# Patient Record
Sex: Female | Born: 1970 | Race: White | Hispanic: No | Marital: Married | Smoking: Never smoker
Health system: Southern US, Community
[De-identification: ages and names within clinical notes are randomized; demographics above are authoritative.]

## PROBLEM LIST (undated history)

## (undated) DIAGNOSIS — I2699 Other pulmonary embolism without acute cor pulmonale: Secondary | ICD-10-CM

## (undated) DIAGNOSIS — Z86718 Personal history of other venous thrombosis and embolism: Secondary | ICD-10-CM

## (undated) DIAGNOSIS — M1990 Unspecified osteoarthritis, unspecified site: Secondary | ICD-10-CM

## (undated) DIAGNOSIS — K5900 Constipation, unspecified: Secondary | ICD-10-CM

## (undated) HISTORY — DX: Personal history of other venous thrombosis and embolism: Z86.718

## (undated) HISTORY — PX: OTHER SURGICAL HISTORY: SHX169

## (undated) HISTORY — PX: COLONOSCOPY: SHX174

## (undated) HISTORY — DX: Unspecified osteoarthritis, unspecified site: M19.90

## (undated) HISTORY — DX: Constipation, unspecified: K59.00

## (undated) HISTORY — PX: BUNIONECTOMY: SHX129

---

## 1997-10-11 ENCOUNTER — Other Ambulatory Visit: Admission: RE | Admit: 1997-10-11 | Discharge: 1997-10-11 | Payer: Self-pay | Admitting: *Deleted

## 1998-10-24 ENCOUNTER — Other Ambulatory Visit: Admission: RE | Admit: 1998-10-24 | Discharge: 1998-10-24 | Payer: Self-pay | Admitting: Obstetrics and Gynecology

## 1999-05-08 ENCOUNTER — Other Ambulatory Visit: Admission: RE | Admit: 1999-05-08 | Discharge: 1999-05-08 | Payer: Self-pay | Admitting: Obstetrics and Gynecology

## 1999-11-30 ENCOUNTER — Inpatient Hospital Stay (HOSPITAL_COMMUNITY): Admission: AD | Admit: 1999-11-30 | Discharge: 1999-11-30 | Payer: Self-pay | Admitting: *Deleted

## 1999-12-01 ENCOUNTER — Inpatient Hospital Stay (HOSPITAL_COMMUNITY): Admission: AD | Admit: 1999-12-01 | Discharge: 1999-12-04 | Payer: Self-pay | Admitting: Obstetrics and Gynecology

## 1999-12-01 ENCOUNTER — Encounter (INDEPENDENT_AMBULATORY_CARE_PROVIDER_SITE_OTHER): Payer: Self-pay | Admitting: Specialist

## 1999-12-08 ENCOUNTER — Encounter: Admission: RE | Admit: 1999-12-08 | Discharge: 2000-03-07 | Payer: Self-pay | Admitting: Obstetrics and Gynecology

## 2000-01-22 ENCOUNTER — Other Ambulatory Visit: Admission: RE | Admit: 2000-01-22 | Discharge: 2000-01-22 | Payer: Self-pay | Admitting: Obstetrics and Gynecology

## 2001-02-25 ENCOUNTER — Other Ambulatory Visit: Admission: RE | Admit: 2001-02-25 | Discharge: 2001-02-25 | Payer: Self-pay | Admitting: Obstetrics and Gynecology

## 2002-03-20 ENCOUNTER — Other Ambulatory Visit: Admission: RE | Admit: 2002-03-20 | Discharge: 2002-03-20 | Payer: Self-pay | Admitting: Obstetrics and Gynecology

## 2003-03-15 ENCOUNTER — Other Ambulatory Visit: Admission: RE | Admit: 2003-03-15 | Discharge: 2003-03-15 | Payer: Self-pay | Admitting: Obstetrics and Gynecology

## 2003-09-24 ENCOUNTER — Inpatient Hospital Stay (HOSPITAL_COMMUNITY): Admission: AD | Admit: 2003-09-24 | Discharge: 2003-09-24 | Payer: Self-pay | Admitting: Obstetrics & Gynecology

## 2003-09-26 ENCOUNTER — Encounter (INDEPENDENT_AMBULATORY_CARE_PROVIDER_SITE_OTHER): Payer: Self-pay | Admitting: Specialist

## 2003-09-26 ENCOUNTER — Inpatient Hospital Stay (HOSPITAL_COMMUNITY): Admission: RE | Admit: 2003-09-26 | Discharge: 2003-09-29 | Payer: Self-pay | Admitting: Obstetrics and Gynecology

## 2003-11-06 ENCOUNTER — Other Ambulatory Visit: Admission: RE | Admit: 2003-11-06 | Discharge: 2003-11-06 | Payer: Self-pay | Admitting: Obstetrics and Gynecology

## 2005-06-24 ENCOUNTER — Other Ambulatory Visit: Admission: RE | Admit: 2005-06-24 | Discharge: 2005-06-24 | Payer: Self-pay | Admitting: Obstetrics and Gynecology

## 2007-11-11 ENCOUNTER — Encounter (HOSPITAL_COMMUNITY): Payer: Self-pay | Admitting: Obstetrics and Gynecology

## 2007-11-11 ENCOUNTER — Ambulatory Visit (HOSPITAL_COMMUNITY): Admission: RE | Admit: 2007-11-11 | Discharge: 2007-11-11 | Payer: Self-pay | Admitting: Obstetrics and Gynecology

## 2010-06-21 ENCOUNTER — Emergency Department (HOSPITAL_COMMUNITY): Payer: BLUE CROSS/BLUE SHIELD

## 2010-06-21 ENCOUNTER — Observation Stay (HOSPITAL_COMMUNITY)
Admission: EM | Admit: 2010-06-21 | Discharge: 2010-06-23 | Disposition: A | Discharge status: Home / Self Care | Payer: BLUE CROSS/BLUE SHIELD | Attending: Internal Medicine | Admitting: Internal Medicine

## 2010-06-21 DIAGNOSIS — A088 Other specified intestinal infections: Secondary | ICD-10-CM | POA: Insufficient documentation

## 2010-06-21 DIAGNOSIS — R0602 Shortness of breath: Secondary | ICD-10-CM | POA: Insufficient documentation

## 2010-06-21 DIAGNOSIS — G43909 Migraine, unspecified, not intractable, without status migrainosus: Secondary | ICD-10-CM | POA: Insufficient documentation

## 2010-06-21 DIAGNOSIS — E876 Hypokalemia: Secondary | ICD-10-CM | POA: Insufficient documentation

## 2010-06-21 DIAGNOSIS — I2699 Other pulmonary embolism without acute cor pulmonale: Principal | ICD-10-CM | POA: Insufficient documentation

## 2010-06-21 DIAGNOSIS — R071 Chest pain on breathing: Secondary | ICD-10-CM | POA: Insufficient documentation

## 2010-06-21 DIAGNOSIS — D72829 Elevated white blood cell count, unspecified: Secondary | ICD-10-CM | POA: Insufficient documentation

## 2010-06-21 LAB — BASIC METABOLIC PANEL
BUN: 4 mg/dL — ABNORMAL LOW (ref 6–23)
CO2: 25 mEq/L (ref 19–32)
Calcium: 8.4 mg/dL (ref 8.4–10.5)
Creatinine, Ser: 0.64 mg/dL (ref 0.4–1.2)
GFR calc Af Amer: >60 mL/min (ref >60)

## 2010-06-21 LAB — DIFFERENTIAL
Basophils Absolute: 0 10*3/uL (ref 0.0–0.1)
Eosinophils Absolute: 0.2 10*3/uL (ref 0.0–0.7)
Lymphocytes Relative: 10 % — ABNORMAL LOW (ref 12–46)
Neutro Abs: 16.5 10*3/uL — ABNORMAL HIGH (ref 1.7–7.7)
Neutrophils Relative %: 87 % — ABNORMAL HIGH (ref 43–77)

## 2010-06-21 LAB — CBC
HCT: 36.6 % (ref 36.0–46.0)
Hemoglobin: 12.4 g/dL (ref 12.0–15.0)
MCH: 32.4 pg (ref 26.0–34.0)
MCV: 95.6 fL (ref 78.0–100.0)
Platelets: 215 10*3/uL (ref 150–400)
RBC: 3.83 MIL/uL — ABNORMAL LOW (ref 3.87–5.11)
RDW: 12.9 % (ref 11.5–15.5)
WBC: 19 10*3/uL — ABNORMAL HIGH (ref 4.0–10.5)

## 2010-06-21 LAB — D-DIMER, QUANTITATIVE: D-Dimer, Quant: 0.74 ug/mL-FEU — ABNORMAL HIGH (ref 0.00–0.48)

## 2010-06-21 LAB — POCT CARDIAC MARKERS
Myoglobin, poc: 34.3 ng/mL (ref 12–200)
Troponin i, poc: <0.05 ng/mL (ref 0.00–0.09)

## 2010-06-21 MED ORDER — IOHEXOL 300 MG/ML  SOLN: 100.0000 mL | Freq: Once | INTRAMUSCULAR | Status: AC | PRN

## 2010-06-22 LAB — BASIC METABOLIC PANEL
BUN: 4 mg/dL — ABNORMAL LOW (ref 6–23)
CO2: 25 mEq/L (ref 19–32)
Calcium: 8.7 mg/dL (ref 8.4–10.5)
GFR calc Af Amer: >60 mL/min (ref >60)
GFR calc non Af Amer: >60 mL/min (ref >60)
Glucose, Bld: 93 mg/dL (ref 70–99)
Potassium: 3.6 mEq/L (ref 3.5–5.1)
Sodium: 139 mEq/L (ref 135–145)

## 2010-06-22 LAB — CBC
MCH: 31.3 pg (ref 26.0–34.0)
RDW: 12.9 % (ref 11.5–15.5)

## 2010-06-22 LAB — CARDIAC PANEL(CRET KIN+CKTOT+MB+TROPI)
CK, MB: 1 ng/mL (ref 0.3–4.0)
Relative Index: INVALID (ref 0.0–2.5)
Total CK: 51 U/L (ref 7–177)
Total CK: 48 U/L (ref 7–177)
Troponin I: 0.04 ng/mL (ref 0.00–0.06)

## 2010-06-23 DIAGNOSIS — I2699 Other pulmonary embolism without acute cor pulmonale: Secondary | ICD-10-CM

## 2010-06-23 LAB — MAGNESIUM: Magnesium: 2.2 mg/dL (ref 1.5–2.5)

## 2010-06-23 LAB — URINALYSIS, ROUTINE W REFLEX MICROSCOPIC
Bilirubin Urine: NEGATIVE
Glucose, UA: NEGATIVE mg/dL
Protein, ur: NEGATIVE mg/dL
Specific Gravity, Urine: 1.014 (ref 1.005–1.030)
pH: 7 (ref 5.0–8.0)

## 2010-06-23 LAB — PROTIME-INR
INR: 0.98 (ref 0.00–1.49)
Prothrombin Time: 13.2 seconds (ref 11.6–15.2)

## 2010-06-23 LAB — COMPREHENSIVE METABOLIC PANEL
CO2: 25 mEq/L (ref 19–32)
Calcium: 8.5 mg/dL (ref 8.4–10.5)
Creatinine, Ser: 0.69 mg/dL (ref 0.4–1.2)
GFR calc non Af Amer: >60 mL/min (ref >60)
Glucose, Bld: 94 mg/dL (ref 70–99)
Sodium: 140 mEq/L (ref 135–145)
Total Protein: 6.5 g/dL (ref 6.0–8.3)

## 2010-06-23 LAB — CBC
HCT: 36.1 % (ref 36.0–46.0)
Hemoglobin: 11.9 g/dL — ABNORMAL LOW (ref 12.0–15.0)
RBC: 3.79 MIL/uL — ABNORMAL LOW (ref 3.87–5.11)

## 2010-06-23 LAB — PHOSPHORUS: Phosphorus: 3.9 mg/dL (ref 2.3–4.6)

## 2010-06-23 LAB — URINE MICROSCOPIC-ADD ON

## 2010-06-24 LAB — URINE CULTURE
Colony Count: NO GROWTH
Culture: NO GROWTH

## 2010-06-24 NOTE — Discharge Summary (Signed)
Tara Tucker, Tara Tucker                  ACCOUNT NO.:  [id number]  MEDICAL RECORD NO.:  [id number]           PATIENT TYPE:  I  LOCATION:  5527                         FACILITY:  [facility]  PHYSICIAN:  Marinda Elk, M.D.DATE OF BIRTH:  [birth year]  DATE OF ADMISSION:  06/21/2010 DATE OF DISCHARGE:  06/23/2010                              DISCHARGE SUMMARY   PRIMARY CARE DOCTOR:  [facility] [place]  DISCHARGE DIAGNOSES: 1. Acute bilateral small pulmonary embolisms. 2. Leukocytosis. 3. Hyperkalemia. 4. Viral gastroenteritis.  DISCHARGE MEDICATIONS: 1. Guaifenesin 120 mg subcutaneous daily. 2. Warfarin 5 mg daily. 3. Stop taking Excedrin. 4. Hydrocodone 5/325mg  por q4hrs prn.  CONSULTANTS:  None.  PROCEDURES PERFORMED: 1. CT angio of the chest that showed positive study for peripheral PE     in the lower lungs. 2. Chest x-ray that showed linear atelectasis at the lungs bases.  BRIEF ADMITTING HISTORY AND PHYSICAL:  This is a 40 year old female with no significant past medical history who was in regular state of health. She had just spent her spring vacation away.  She had a flight of 3 hours on Monday for a business job, then had to drive from [place] to [place] in night.  After this, she started complaining of some pleuritic chest pain, tightness and shortness of breath.  She presented to the Urgent Care with an elevated D-dimer and was transferred to [facility].  CT angio was done that showed results above.  She also admits to feeling nauseated.  She has been having three episodes of vomiting yesterday and before that she has some diarrhea before flying, but not today.  She denies any abdominal pain, extremity swelling.  Denies any palpitations and heaviness with deep inspiration.  PHYSICAL EXAMINATION:  VITAL SIGNS:  Temperature 98, blood pressure 136/72, pulse 81, respirations 18, she is satting 98% on room air. HEENT:  Normocephalic, atraumatic.   Pupils equally round and reactive to light and accommodation.  Extraocular movements are intact. NECK:  Supple.  No JVD. CARDIOVASCULAR:  She has a regular rate and rhythm with a positive S1. LUNGS:  Good air movement and clear to auscultation. ABDOMEN:  Positive bowel sounds, nontender, nondistended, soft. EXTREMITIES:  Positive pulses, no clubbing, cyanosis or edema.  Hoffman negative. CNS:  Wake, alert and oriented x4.  Nonfocal.  CT angio results are above.  White count 19, hemoglobin of 12, platelet count of 250.  Sodium 138, potassium 3.4, chloride 180, bicarb of 25, glucose of 104, BUN of 4, creatinine 0.6, and calcium of 8.5.  ASSESSMENT AND PLAN: 1. Acute bilateral pulmonary embolism probably secondary to     combination of dehydration secondary to viral gastroenteritis and     long plane flight with a long drive.  On admission, she was stable,     she was on IV heparin and Coumadin.  She was given one dose of 7.5.     She was changed to Lovenox and was sent home on two days of 7.5 and     then 5 mg daily.  She will follow up with her primary care doctor  with no complications.  I have told to stay off the Excedrin as     this has aspirin and has not a good combination to be on Coumadin     and taking aspirin.  Does not need it for headaches. 2. Leukocytosis, resolved that is probably secondary to acute     bilateral pulmonary embolism.  She remained afebrile. 3. Hypokalemia that is probably secondary to dehydration and vomiting.     This was repleted.  The magnesium was checked, which was within     normal limits. 4. Viral gastroenteritis.  She has Zofran and IV fluids.  This is     resolved.  She is now tolerating her diet without any difficulties.  VITAL SIGNS ON THE DAY OF DISCHARGE:  Temperature 98, pulse 79, respiration 18, blood pressure 96/71, she is satting 93% on room air.  LABS ON THE DAY OF DISCHARGE:  Urine microscopy negative.  Her urine pregnancy test is  negative.  Her phosphorus is 3.9.  Her magnesium is 2.2.  Her sodium is 140, potassium 3.9, chloride 110, bicarb of 25, glucose of 94, BUN of 9, creatinine is 0.6.  LFTs are within normal limits except for alkaline phosphatase, which is borderline low at 31, albumin of 3.2.  Her white count of 7.6, hemoglobin of 11.2, and an MCV of 95, and platelet count of 185.  Her INR is 0.9.     Marinda Elk, M.D.     AF/MEDQ  D:  06/23/2010  T:  06/24/2010  Job:  [id number]  cc:   [facility]  Electronically Signed by Lambert Keto M.D. on 06/24/2010 03:31:09 PM

## 2010-07-03 NOTE — H&P (Signed)
NAME:  Tara Tucker, Tara Tucker                  ACCOUNT NO.:  [id number]  MEDICAL RECORD NO.:  [id number]           PATIENT TYPE:  E  LOCATION:  [facility]                         FACILITY:  [facility]  PHYSICIAN:  Kalicia Dufresne I Rafaela Dinius, MD      DATE OF BIRTH:  [birth year]  DATE OF ADMISSION:  06/21/2010 DATE OF DISCHARGE:                             HISTORY & PHYSICAL   PRIMARY CARE PHYSICIAN:  [facility].  CHIEF COMPLAINT:  Shortness of breath and chest pain for 1 day.  HISTORY OF PRESENT ILLNESS:  This is a 39 year old female with no previous medical history, who was on her regular status health.  She had just spent a spring vacation of [place].  She had to fly 3 hours and she is supposed to fly again on Monday for business/job.  Last night, she started to complain of some pleuritic chest pain and tightness 4/10 associated with shortness of breath.  She has to present to Urgent Care, where a D-Dimer found to be elevated and the [facility] was[facility] evaluation of possibility of pulmonary embolism.  CT angio of the chest bilateral small PE.  The patient currently complained of some migraine headache.  She also admitted and she is feeling nauseated.  She had experienced 3 episode of vomiting yesterday, but not today and 3-4 episode of diarrhea yesterday, but not today.  Denies any abdominal pain, denies any lower extremity swelling and denies any palpitation.  She is still having some heaviness on her chest, may leave with deep inspiration.  PAST MEDICAL HISTORY:  None.  PAST SURGICAL HISTORY:  She has history of tubal ligation and D and C of her endometrial polyp.  She says she did not have anymore menstrual cycle after the D and C procedure.  FAMILY HISTORY:  No history of blood clot and both parent were alive with no active medical issue.  She denies any use of contraceptive.  SOCIAL HISTORY:  She denies any alcohol abuse or drinking.  She work as a Research scientist (medical) at [employer].  She has 2 children.  She lives with her husband.  REVIEW OF SYSTEMS:  Systemic review as above.  PHYSICAL EXAMINATION:  VITAL SIGNS:  Temperature 98.1, blood pressure 136/72, pulse 81, respiratory rate 18 and O2 sat 98% on room air. HEENT:  Normocephalic and atraumatic.  Pupils are equal, reactive to light and accommodation.  Extraocular muscle movement within normal. NECK:  Supple.  No lymphadenopathy. HEART:  S1 and S2 with no added sounds. LUNGS:  Normal vesicular breathing with equal air entry. ABDOMEN:  Soft and nontender.  Bowel sounds positive. EXTREMITIES:  Without lower edema.  Hoffmann sign negative. CNS:  The patient is awake, alert and oriented x3 with no focal neurological deficit.  PROCEDURE:  CT angio.  There is a small bilateral pulmonary emboli.  LABORATORY DATA:  White blood cells 19, hemoglobin 12.4, hematocrit 36.6 and platelets 215.  Sodium 138, potassium 3.4, chloride 108, CO2 25, glucose 104, BUN 4, creatinine 0.64, calcium 8.4.  ASSESSMENT: 1. Bilateral small pulmonary emboli. 2. Leukocytosis. 3. Hypokalemia.  PLAN: 1.  The patient will be admitted to telemetry.  The patient already     started on Lovenox.  We will continue with Lovenox per pharmacy.     Also, we will start on Coumadin.  The patient already was started     on Lovenox.  We will hold on thrombophilic panel secondary to the     interaction.  As we mentioned, the patient already started on     Lovenox.  We will cycle cardiac enzyme and get EKG. 2. Leukocytosis could be secondary to stress or positive urine tract     infection.  I will proceed with urinalysis and culture.  Chest x-     ray was negative.  The patient currently has no fever.  We will     hold on blood culture.  We will continue maintaining patient while     process. 3. Hypokalemia replacement. 4. Nausea, vomiting and diarrhea lasted for 1 day.  The patient     currently denies any nausea and vomiting, possibility  of viral     gastroenteritis.  I will treat the patient empirically with some     Zofran p.r.n.  Currently, the patient has no any diarrhea.  We will     hold on any stool studies and advance home care.  We will place the     patient on regular diet.  Abdomen seems soft and nontender.  I will     get the whole LFTs panel.  The patient will be admitted under     observation.  Further recommendation, hospital course progress.     Delainey Winstanley Bosie Helper, MD     HIE/MEDQ  D:  06/22/2010  T:  06/22/2010  Job:  [id number]  Electronically Signed by Ebony Cargo MD on 07/03/2010 11:31:23 AM

## 2010-07-22 NOTE — Op Note (Signed)
[name], Tara Tucker                  ACCOUNT NO.:  [id number]   MEDICAL RECORD NO.:  [id number]          PATIENT TYPE:  AMB   LOCATION:  SDC                           [facility]ACILITY:  [facility]   PHYSICIAN:  Zelphia Cairo, MD    DATE OF BIRTH:  August 17, 1970   DATE OF PROCEDURE:  11/11/2007  DATE OF DISCHARGE:                               OPERATIVE REPORT   PREOPERATIVE DIAGNOSES:  1. Menorrhagia.  2. Suspected endometrial polyp.   POSTOPERATIVE DIAGNOSES:  1. Menorrhagia.  2. Suspected endometrial polyp.   PROCEDURES:  1. Cervical block.  2. Hysteroscopy.  3. Dilation and curettage.  4. NovaSure ablation.   SURGEON:  Zelphia Cairo, MD   ANESTHESIA:  MAC with local.   SPECIMEN:  Endometrial curettings to pathology.   FLUID:  Deficit 25 mL.   COMPLICATIONS:  None.   ESTIMATED BLOOD LOSS:  Minimal.   The patient was stable and taken to recovery room.   PROCEDURE IN DETAIL:  [name] was taken to the operating room where she  was given adequate anesthesia, where she was placed in the dorsal  lithotomy position using Allen stirrups.  She was prepped and draped in  a sterile fashion and a red rubber catheter was used to drain her  bladder for approximately 50 mL of clear urine.  Bivalve speculum was  placed in the vagina and a single-tooth tenaculum was placed on the  anterior lip of the cervix.  The uterus was sounded to 8-1/2 cm.  Cervical length was sounded to 4 cm.  The cervix was then serially  dilated and the hysteroscope was inserted.  A survey of the uterine  cavity was performed.  There were noted to be 2 small polypoid masses on  the left posterior wall.  Bilateral ostia were visualized and appeared  normal.  The hysteroscope was then removed and a gentle curetting was  performed.  Specimen was placed on Telfa and passed off and sent to  pathology.  The NovaSure device was then inserted and an ablation was performed  using standard manufacture guidelines.  NovaSure was  removed.  Single-  tooth tenaculum was removed.  The cervix was noted to be hemostatic.  Speculum was removed and the patient was taken to recovery room in  stable condition.      Zelphia Cairo, MD  Electronically Signed     GA/MEDQ  D:  11/11/2007  T:  11/11/2007  Job:  [id number]

## 2010-07-25 NOTE — Discharge Summary (Signed)
[facility]  Patient:    Tara Tucker, Tara Tucker                           MRN: [id number] Adm. Date:  [id number] Disc. Date: 12/04/99 Attending:  Madelyn Flavors Dictator:   Danie Chandler, R.N.                           Discharge Summary  ADMISSION DIAGNOSIS:          Intrauterine pregnancy at term with onset of labor.  DISCHARGE DIAGNOSES:          1. Intrauterine pregnancy at term with onset                                  of labor.                               2. Arrest of descent.                               3. Chorioamnionitis.  PROCEDURE:                    On December 01, 1999, primary low transverse cesarean section.  REASON FOR ADMISSION:         The patient is a 40 year old married white female gravida 1, para 0 with an estimated date of confinement of December 02, 1999, placing her at approximately 39-6/[redacted] weeks gestation. Prenatal care has basically been uncomplicated.  Group B Beta strep culture was negative.  The patient had had complaints of leaking since November 29, 1999.  She had tested negative for rupture of membranes until the day of admission when she was found to be Nitrazine and fern positive as well as having uterine contractions.  An IUPC was placed on the morning of December 01, 1999, and the cervix at that time was 6 cm dilated, slightly swollen, -1 station, vertex with caput formation.  The patient pushed for approximately two and one quarter hours and the vertex did not descend below 0 station.  She developed a temperature of 101.  She had been on penicillin prophylactically because of her suspected status of being prolonged rupture. This was changed to Unasyn IV prior to surgery.  The recommendation for cesarean section was made.  HOSPITAL COURSE:              The patient was taken to the operating room and underwent the above noted procedure without complications.  This was productive of a viable female infant  with Apgars of 9 at one minute and 9 at five minutes.  An arterial cord pH was 7.33.  Postoperatively, the patient did well. She was continued on IV antibiotics and on postoperative day #1 the patients hemoglobin was 8.7, hematocrit 25.5 and white blood cell count 15.9. She was started on iron daily and had a repeat CBC ordered for the following day.  On postoperative day #2 the patient had a good return of bowel function and was tolerating a regular diet.  She was also ambulating well without difficulty and had good pain control.  Her hemoglobin on this day was 9.5. Her IV antibiotics were discontinued and she was continued  on iron daily.  She as discharged home on postoperative day #3.  CONDITION ON DISCHARGE:       Good.  DIET:                         Regular as tolerated.  ACTIVITY:                     No heavy lifting, no driving, no vaginal entry.  FOLLOW-UP:                    She is to follow up in the office in one to two weeks for incision check and she is to call for temperature greater than 100 degrees, persistent nausea, vomiting, heavy vaginal bleeding and/or redness or drainage from the incision site.  DISCHARGE MEDICATIONS:        1. Prenatal vitamin one p.o. q.d.                               2. Tylox as directed by M.D. DD:  12/04/99 TD:  12/04/99 Job: [id number]

## 2010-07-25 NOTE — Op Note (Signed)
NAME:  Tara Tucker, Tara Tucker                            ACCOUNT NO.:  [id number]   MEDICAL RECORD NO.:  [id number]                   PATIENT TYPE:  INP   LOCATION:  9135                                 FACILITY:  [facility]   PHYSICIAN:  Juluis Mire, M.D.                DATE OF BIRTH:  06-23-1970   DATE OF PROCEDURE:  09/26/2003  DATE OF DISCHARGE:                                 OPERATIVE REPORT   PREOPERATIVE DIAGNOSES:  Intrauterine pregnancy at 39 weeks with prior  cesarean section, desires a repeat, multiparous, desires sterility.   POSTOPERATIVE DIAGNOSES:  Intrauterine pregnancy at 39 weeks with prior  cesarean section, desires a repeat, multiparous, desires sterility.   OPERATION:  Low transverse cesarean section with bilateral tubal ligation.   SURGEON:  Juluis Mire, M.D.   ANESTHESIA:  Epidural.   ESTIMATED BLOOD LOSS:  500 mL.   PACKS/DRAINS:  None.   BLOOD REPLACED:  None.   COMPLICATIONS:  None.   INDICATIONS FOR PROCEDURE:  Dictated in the history and physical.   DESCRIPTION OF PROCEDURE:  The patient was taken to the OR and placed in  supine position with a left lateral tilt.  After a satisfactory level of  spinal anesthesia was obtained, the abdomen was prepped out with Betadine  and draped in a sterile field. A prior low transverse skin incision was  excised. The incision was then extended through the subcutaneous tissue. The  fascia was entered sharply, incision in the fascia extended laterally. The  fascia was taken off the muscle superiorly and inferiorly, rectus muscles  were separated in the midline.  Anterior peritoneum was entered, incision in  the peritoneum was extended both superiorly and inferiorly, a low transverse  bladder flap was developed without difficulty.  A low transverse uterine  incision was begun with a knife and extended using manual traction.  Amniotic fluid was clear, there was some large bleeding sinuses.  Vertex  would not deliver up  with manual traction, a vacuum extractor was put in  placed and with fundal pressure the vertex was delivered along with the  remaining fetus. The infant was a viable female weighing 9 pounds and 4  ounces, Apgar were 8 & 9, umbilical cord pH was 7.25.  The placenta was  delivered manually, the uterus was exteriorized for closure, uterus was  closed with running locking suture of #0 chromic using a two layer closure  technique. Areas of bleeding were brought under control with figure-of-  eights of 2-0 chromic.   The tubes and ovaries were unremarkable.  The patient again expressed desire  of sterilization.  A mid segment of each tube was elevated with a Babcock.  A hole was made in the avascular of the mesosalpinx. Individual ligatures of  #0 plain catgut were used to ligate _________ segmented tube. The  intervening segment of tube was excised.  The cut end  of the tube were then  cauterized using the Bovie. We had good hemostasis bilaterally, ovaries  appeared normal. The uterus was returned to the abdominal cavity, we  irrigated the pelvis, we had good hemostasis.  Muscles and peritoneum closed  with running suture of 3-0 Vicryl, fascia closed with running suture of #0  PDS. The subcu was closed with running suture of 3-0 Vicryl, skin was closed  with staples and Steri-Strips.  Sponge, instrument and needle count reported  as correct by circulating nurse x2.  Foley catheter remained clear at the  time of closure.  The patient tolerated the procedure well and was returned  to the recovery room in good condition.                                               Juluis Mire, M.D.    JSM/MEDQ  D:  09/26/2003  T:  09/26/2003  Job:  [id number]

## 2010-07-25 NOTE — H&P (Signed)
NAME:  Tara Tucker, Tara Tucker                            ACCOUNT NO.:  [id number]   MEDICAL RECORD NO.:  [id number]                   PATIENT TYPE:  INP   LOCATION:  NA                                   [facility]   PHYSICIAN:  Juluis Mire, M.D.                DATE OF BIRTH:  08-20-1970   DATE OF ADMISSION:  DATE OF DISCHARGE:                                HISTORY & PHYSICAL   The patient is a 40 year old, gravida 2, para 1, married white female who  presents for repeat cesarean section and permanent sterilization.   The patient's last menstrual period was October 22 giving her an estimated  date of confinement of July 29, estimated gestational age of [redacted] weeks.  This  is consistent with initial evaluation and early ultrasound.  She had had a  prior low transverse cesarean section for failure to progress. We had  offered her the option of trial labor.  However, she desires to proceed with  repeat cesarean section for which she is admitted at the present time. She  also desires permanent sterilization. Alternative forms of birth control  have been discussed, the potential irreversibility of sterilization is  explained. A failure rate of 1 in 200 is quoted, failure can be in the form  of ectopic pregnancy requiring further surgical management. Her prenatal  course has otherwise been uncomplicated.   ALLERGIES:  No known drug allergies.   MEDICATIONS:  Prenatal vitamins.   PRENATAL LABS:  She is A positive.  Negative antibody screen.  Nonreactive  serology.  Equivocal rubella screen, negative hepatitis B surface antigen,  nonreactive HIV screen. Her 50 g Glucola was also within normal limits.   For past medical history, family history and social history, please see  prenatal records.  Review of systems is noncontributory.   PHYSICAL EXAMINATION:  VITAL SIGNS:  The patient is afebrile with stable  vital signs.  HEENT:  The patient is normocephalic, pupils equal round and reactive to  light and accommodation. Extraocular movements intact.  Sclerae and  conjunctivae clear, oropharynx clear.  NECK:  Without thyromegaly.  BREASTS:  Glandular but no discreet masses.  LUNGS:  Clear.  CARDIOVASCULAR:  Regular rate and rhythm with a grade 2/6 systolic ejection  murmur, no clicks or gallops.  ABDOMEN:  Gravid uterus consistent with __________ fetal weight is  approximately 8 1/2 pounds.  PELVIC:  The cervix has been long and closed on prior exam, not examined  recently.  EXTREMITIES:  Trace edema.  NEUROLOGIC:  Grossly within normal limits.   IMPRESSION:  1. Intrauterine pregnancy at term with prior cesarean section, desires     repeat.  2. Multiparous, desired sterility.   PLAN:  Patient to undergo repeat cesarean section with bilateral tubal  ligation. The risks of surgery have been discussed including the risk of  infection, the risk of hemorrhage that could require transfusion  with the  risk of AIDS  or hepatitis. The risk of injury to adjacent organs that could require  further exploratory surgery, this includes bladder, bowel or ureters, risk  of deep venous thrombosis and pulmonary embolus.  The patient expressed an  understanding of the indications and risks.                                               Juluis Mire, M.D.    JSM/MEDQ  D:  09/26/2003  T:  09/26/2003  Job:  [id number]

## 2010-07-25 NOTE — Discharge Summary (Signed)
NAME:  Tara Tucker, HAEBERLE                            ACCOUNT NO.:  [id number]   MEDICAL RECORD NO.:  [id number]                   PATIENT TYPE:  INP   LOCATION:  9135                                 FACILITY:  [facility]   PHYSICIAN:  Freddy Finner, M.D.                DATE OF BIRTH:  1970/12/11   DATE OF ADMISSION:  09/26/2003  DATE OF DISCHARGE:  09/29/2003                                 DISCHARGE SUMMARY   ADMITTING DIAGNOSES:  1. Intrauterine pregnancy at 75 weeks estimated gestational age.  2. Previous cesarean section, desires repeat.  3. Multiparity, desires sterilization.   DISCHARGE DIAGNOSES:  1. Status post low transverse cesarean section.  2. Viable female infant.  3. Permanent sterilization.   PROCEDURE:  Repeat low transverse cesarean section.   REASON FOR ADMISSION:  Please see dictated H&P.   HOSPITAL COURSE:  The patient is a 40 year old white married female gravida  2 para 1 that was admitted to [facility] for a repeat  cesarean section and permanent sterilization.  The patient had been offered  a trial of labor; however, she desired to repeat with a repeat cesarean  section.  The patient had also requested permanent sterilization.  On the  morning of admission the patient was taken to the operating room where  spinal anesthesia was administered without difficulty.  A low transverse  incision was made with the delivery of a viable female infant weighing 9  pounds 4 ounces, Apgars of 8 at one minute and 9 at five minutes.  Umbilical  cord pH was 7.25.  Bilateral tubal ligation was performed without  difficulty.  On postoperative day #1 the patient was without complaint.  Vital signs were stable; she was afebrile.  Abdomen was soft with good  return of bowel function.  Abdominal dressing was noted to be clean, dry,  and intact.  Fundus was firm and nontender.  Labs revealed hemoglobin of  8.3; platelet count 145,000; wbc count of 11.2.  On postoperative day #2  the  patient was without complaint.  Vital signs were stable.  Abdomen was soft,  fundus was firm and nontender.  Incision was noted to have a small amount of  oozing noted from the mid point and the left margin; however, no active  bleeding was noted.  She was ambulating well.  Repeat hemoglobin revealed  hemoglobin of 8.3; platelet count 148,000; wbc count of 11.4.  The patient  was started on some iron supplementation.  On postoperative day #3 the  patient was without complaint.  Vital signs were stable.  Incision was noted  to have a scant amount of oozing; however, the skin seemed to be intact.  The patient was tolerating a regular diet and ambulating well.  Staples  remained intact and the patient was discharged home.   CONDITION ON DISCHARGE:  Good.   DIET:  Regular as tolerated.  ACTIVITY:  No heavy lifting, no driving x2 weeks, no vaginal entry.   FOLLOW-UP:  The patient is to follow up in the office in 3-4 days for staple  removal and an incision check.  She is to call for temperature greater than  100 degrees, persistent nausea and vomiting, heavy vaginal bleeding, and/or  redness or drainage from the incisional site.   DISCHARGE MEDICATIONS:  1. Percocet 5/325 #30 one p.o. q.4-6h. p.r.n.  2. Prenatal vitamins one p.o. daily.  3. Chromagen Forte one p.o. daily p.r.n.     Julio Sicks, N.P.                        Freddy Finner, M.D.    CC/MEDQ  D:  10/17/2003  T:  10/17/2003  Job:  [id number]

## 2010-07-25 NOTE — Op Note (Signed)
[facility]  Patient:    Tara Tucker, Tara Tucker                           MRN: [id number] Proc. Date: 12/01/99 Adm. Date:  [id number] Attending:  Madelyn Flavors                           Operative Report  PREOPERATIVE DIAGNOSES:       1. Arrest of descent.                               2. Chorioamnionitis.  POSTOPERATIVE DIAGNOSES:      1. Arrest of descent.                               2. Chorioamnionitis.  OPERATION:                    Low transverse cesarean section.  SURGEON:                      Guy Sandifer. Arleta Creek, M.D.  ASSISTANT:  ANESTHESIA:                   Epidural anesthesia.  ANESTHESIOLOGIST:             Ellison Hughs., M.D.  ESTIMATED BLOOD LOSS:         800 cc.  FINDINGS:                     A viable female infant with Apgars of 9 and 9 at one and five minutes, respectively.  Arterial cord pH 7.33, birth weight pending.  INDICATIONS AND CONSENT:      The patient is a 40 year old married white female, G1, P0, with an EDC of December 02, 1999, placing her at 39-6/7 weeks.  Prenatal care was essentially uncomplicated.  Group B Beta strep culture is negative.  She had complaints of leaking since November 29, 1999. She had negative testing for rupture of membranes until the day of admission when she was found to be Nitrazine and fern positive as well as having uterine contractions.  An IUPC was placed at 8 a.m. and the cervix is 6 cm dilated, slightly swollen, -1 station, vertex, with caput formation.  Course of her labor is followed closely.  She pushes for two and one quarter hours.  The vertex does not descend below 0 station. She also developed temperature of 101.  She had been on penicillin prophylactically because of her suspected status of being prolonged rupture.  This was changed to Unasyn IV immediately prior to surgery.  Recommendation for cesarean section was made. Risks and complications were discussed with  the patient and her husband including, but not limited to infection, bowel, bladder or ureteral damage, bleeding requiring transfusion of blood product and possible transfusion reaction, HIV and hepatitis acquisition, DVT, PE, pneumonia.  All questions answered.  DESCRIPTION OF PROCEDURE:     The patient was taken to the operating room. Foley catheter is in place. Epidural is augmented to the surgical level.  She is prepped and draped in sterile fashion.  She is found to have a single hot spot right in the midline. This is injected subcutaneously with 0.25% plain Marcaine.  Skin is then incised in a low transverse manner and dissection carried out in layers to the peritoneum.  Peritoneum is sharply incised and extended superiorly and inferiorly.  Vesicouterine peritoneum is taken down cephalolaterally. The bladder flap is developed and bladder blade is placed. The uterus is incised in a low transverse manner. The uterine cavity is entered bluntly with a hemostat.  The uterine incision is then extended cephalolaterally with the fingers.  The vertex is developed without difficulty.  Oropharynx and nasopharynx were suctioned. The remainder of the infant is delivered.  Good cry and tone is noted.  Cord is clamped and cut and the infant is handed to the awaiting pediatrics team.  Placenta is manually delivered and sent to pathology.  The uterus is closed in running locking fashion with 0 Monocryl sutures.  Imbrication in the center one half of the incision is carried out to obtain complete hemostasis.  Tubes and ovaries are normal.  Anterior peritoneum is closed in running fashion with 0 Monocryl sutures which is also used to reapproximate the pyramidalis muscle in the midline.  Anterior rectus fascia is closed in running fashion with 0 PDS sutures and the skin is closed with clips.  All sponge, needle and instrument counts are correct and the patient is transferred to the recovery room  in stable condition. DD:  12/01/99 TD:  12/02/99 Job: [id number]

## 2010-07-29 ENCOUNTER — Ambulatory Visit: Payer: BLUE CROSS/BLUE SHIELD | Admitting: Hematology & Oncology

## 2010-07-31 ENCOUNTER — Other Ambulatory Visit: Payer: Self-pay | Admitting: Hematology & Oncology

## 2010-07-31 ENCOUNTER — Ambulatory Visit (HOSPITAL_BASED_OUTPATIENT_CLINIC_OR_DEPARTMENT_OTHER): Payer: BC Managed Care – PPO | Admitting: Hematology & Oncology

## 2010-07-31 DIAGNOSIS — I2699 Other pulmonary embolism without acute cor pulmonale: Secondary | ICD-10-CM

## 2010-07-31 DIAGNOSIS — Z7901 Long term (current) use of anticoagulants: Secondary | ICD-10-CM

## 2010-07-31 LAB — CBC WITH DIFFERENTIAL (CANCER CENTER ONLY)
BASO#: 0 10*3/uL (ref 0.0–0.2)
EOS%: 2.8 % (ref 0.0–7.0)
Eosinophils Absolute: 0.3 10*3/uL (ref 0.0–0.5)
HCT: 38.7 % (ref 34.8–46.6)
HGB: 13.2 g/dL (ref 11.6–15.9)
MCH: 31.7 pg (ref 26.0–34.0)
MCHC: 34.1 g/dL (ref 32.0–36.0)
MONO%: 4.3 % (ref 0.0–13.0)
NEUT%: 80 % (ref 39.6–80.0)
RBC: 4.16 10*6/uL (ref 3.70–5.32)

## 2010-08-06 LAB — HYPERCOAGULABLE PANEL, COMPREHENSIVE
Anticardiolipin IgA: 5 APL U/mL (ref <22)
DRVVT 1:1 Mix: 38.4 secs (ref 36.2–44.3)
Protein S Activity: 32 % — ABNORMAL LOW (ref 69–129)

## 2010-08-07 LAB — JAK-2 V617F

## 2010-09-24 ENCOUNTER — Other Ambulatory Visit: Payer: Self-pay | Admitting: Hematology & Oncology

## 2010-09-24 ENCOUNTER — Encounter (HOSPITAL_BASED_OUTPATIENT_CLINIC_OR_DEPARTMENT_OTHER): Payer: BC Managed Care – PPO | Admitting: Hematology & Oncology

## 2010-09-24 DIAGNOSIS — Z7901 Long term (current) use of anticoagulants: Secondary | ICD-10-CM

## 2010-09-24 DIAGNOSIS — I2699 Other pulmonary embolism without acute cor pulmonale: Secondary | ICD-10-CM

## 2010-09-24 LAB — PROTIME-INR (CHCC SATELLITE)
INR: 2 (ref 2.0–3.5)
Protime: 24 s — ABNORMAL HIGH (ref 10.6–13.4)

## 2010-12-10 LAB — CBC
HCT: 40.1
Hemoglobin: 13.8
MCV: 96
Platelets: 216
RDW: 12.5

## 2011-10-30 ENCOUNTER — Encounter: Payer: Self-pay | Admitting: Hematology & Oncology

## 2011-10-30 ENCOUNTER — Encounter (HOSPITAL_BASED_OUTPATIENT_CLINIC_OR_DEPARTMENT_OTHER): Payer: Self-pay

## 2011-10-30 ENCOUNTER — Emergency Department (HOSPITAL_BASED_OUTPATIENT_CLINIC_OR_DEPARTMENT_OTHER)
Admission: EM | Admit: 2011-10-30 | Discharge: 2011-10-30 | Disposition: A | Discharge status: Home / Self Care | Payer: 59 | Attending: Emergency Medicine | Admitting: Emergency Medicine

## 2011-10-30 ENCOUNTER — Emergency Department (HOSPITAL_BASED_OUTPATIENT_CLINIC_OR_DEPARTMENT_OTHER): Payer: 59

## 2011-10-30 DIAGNOSIS — M7989 Other specified soft tissue disorders: Secondary | ICD-10-CM | POA: Insufficient documentation

## 2011-10-30 DIAGNOSIS — M79609 Pain in unspecified limb: Secondary | ICD-10-CM | POA: Insufficient documentation

## 2011-10-30 DIAGNOSIS — M79606 Pain in leg, unspecified: Secondary | ICD-10-CM

## 2011-10-30 DIAGNOSIS — Z7901 Long term (current) use of anticoagulants: Secondary | ICD-10-CM | POA: Insufficient documentation

## 2011-10-30 HISTORY — DX: Other pulmonary embolism without acute cor pulmonale: I26.99

## 2011-10-30 LAB — CBC WITH DIFFERENTIAL/PLATELET
Basophils Absolute: 0 10*3/uL (ref 0.0–0.1)
Basophils Relative: 0 % (ref 0–1)
Eosinophils Absolute: 0.4 10*3/uL (ref 0.0–0.7)
Eosinophils Relative: 3 % (ref 0–5)
HCT: 37.1 % (ref 36.0–46.0)
Lymphocytes Relative: 19 % (ref 12–46)
MCH: 32.4 pg (ref 26.0–34.0)
MCHC: 34.8 g/dL (ref 30.0–36.0)
MCV: 93.2 fL (ref 78.0–100.0)
Monocytes Absolute: 0.6 10*3/uL (ref 0.1–1.0)
Platelets: 220 10*3/uL (ref 150–400)
RDW: 12.2 % (ref 11.5–15.5)
WBC: 12.2 10*3/uL — ABNORMAL HIGH (ref 4.0–10.5)

## 2011-10-30 LAB — BASIC METABOLIC PANEL
BUN: 13 mg/dL (ref 6–23)
Creatinine, Ser: 0.6 mg/dL (ref 0.50–1.10)
GFR calc Af Amer: >90 mL/min (ref >90)
GFR calc non Af Amer: >90 mL/min (ref >90)
Glucose, Bld: 91 mg/dL (ref 70–99)
Potassium: 4 mEq/L (ref 3.5–5.1)

## 2011-10-30 LAB — PROTIME-INR: Prothrombin Time: 19.5 seconds — ABNORMAL HIGH (ref 11.6–15.2)

## 2011-10-30 MED ORDER — ENOXAPARIN SODIUM 80 MG/0.8ML ~~LOC~~ SOLN: 1.0000 mg/kg | Freq: Two times a day (BID) | SUBCUTANEOUS | Status: DC

## 2011-10-30 NOTE — ED Provider Notes (Signed)
History     CSN: [id number]  Arrival date & time 10/30/11  1839   First MD Initiated Contact with Patient 10/30/11 1901      Chief Complaint  Patient presents with  . Leg Pain    (Consider location/radiation/quality/duration/timing/severity/associated sxs/prior treatment) HPI Comments: Patient complains of bilateral leg swelling and aching soreness for the past 2 days. She is concerned about a blood clot that she has a history of PE in April 2012 has been on Coumadin since. She states her INR was very low at 1.2 2 days ago. She denies any chest pain or shortness of breath. She has frequent air travel. She states compliance with her Coumadin. She is not placed back on Lovenox.  The history is provided by the patient.    Past Medical History  Diagnosis Date  . Pulmonary emboli     Past Surgical History  Procedure Date  . Cesarean section     No family history on file.  History  Substance Use Topics  . Smoking status: Never Smoker   . Smokeless tobacco: Never Used  . Alcohol Use: Yes     weekly    OB History    Grav Para Term Preterm Abortions TAB SAB Ect Mult Living                  Review of Systems  Constitutional: Negative for fever and activity change.  HENT: Negative for congestion and rhinorrhea.   Respiratory: Negative for cough, chest tightness and shortness of breath.   Cardiovascular: Positive for leg swelling. Negative for chest pain.  Gastrointestinal: Negative for nausea, vomiting and abdominal pain.  Genitourinary: Negative for dysuria, frequency and hematuria.  Musculoskeletal: Positive for myalgias and arthralgias. Negative for back pain.  Skin: Negative for rash.  Neurological: Negative for headaches.    Allergies  Review of patient's allergies indicates no known allergies.  Home Medications   Current Outpatient Rx  Name Route Sig Dispense Refill  . ACETAMINOPHEN 500 MG PO TABS Oral Take 1,000 mg by mouth once as needed. For pain    .  ADULT MULTIVITAMIN W/MINERALS CH Oral Take 1 tablet by mouth daily.    . WARFARIN SODIUM 5 MG PO TABS Oral Take 5 mg by mouth daily. 15 mg on Tuesday, Thursday, and Saturday; and 12.5 mg on Sunday, Monday, Wednesday, and Friday      BP 112/68  Pulse 79  Temp 98.5 F (36.9 C) (Oral)  Resp 16  Ht 5\' 4"  (1.626 m)  Wt 160 lb (72.576 kg)  BMI 27.46 kg/m2  SpO2 100%  Physical Exam  Constitutional: She is oriented to person, place, and time. She appears well-developed and well-nourished. No distress.  HENT:  Head: Normocephalic and atraumatic.  Mouth/Throat: Oropharynx is clear and moist. No oropharyngeal exudate.  Eyes: Conjunctivae are normal. Pupils are equal, round, and reactive to light.  Neck: Normal range of motion. Neck supple.  Cardiovascular: Normal rate, regular rhythm and normal heart sounds.   Pulmonary/Chest: Effort normal and breath sounds normal. No respiratory distress.  Abdominal: Soft. There is no tenderness. There is no rebound and no guarding.  Musculoskeletal: Normal range of motion. She exhibits no edema and no tenderness.       No calf asymmetry, no calf tenderness or palpable cords. No appreciable edema. +2 DP and PT pulses. 5 out of 5 strength throughout  Neurological: She is alert and oriented to person, place, and time. No cranial nerve deficit.  Skin: Skin is  warm.    ED Course  Procedures (including critical care time)  Labs Reviewed  CBC WITH DIFFERENTIAL - Abnormal; Notable for the following:    WBC 12.2 (*)     Neutro Abs 8.9 (*)     All other components within normal limits  PROTIME-INR - Abnormal; Notable for the following:    Prothrombin Time 19.5 (*)     INR 1.62 (*)     All other components within normal limits  BASIC METABOLIC PANEL   US Venous Img Lower Bilateral  10/30/2011  *RADIOLOGY REPORT*  Clinical Data: Bilateral leg pain, history of PE, on Coumadin  VENOUS DUPLEX ULTRASOUND OF BILATERAL LOWER EXTREMITIES  Technique:  Gray-scale  sonography with graded compression, as well as color Doppler and duplex ultrasound, were performed to evaluate the deep venous system of both lower extremities from the level of the common femoral vein through the popliteal and proximal calf veins.  Spectral Doppler was utilized to evaluate flow at rest and with distal augmentation maneuvers.  Comparison:  None.  Findings: The visualized bilateral lower extremities appear patent.  Normal compressibility.  Patent color Doppler flow.  Satisfactory spectral Doppler with respiratory variation and response to augmentation.  The greater saphenous vein, where visualized, is patent and compressible bilaterally.  IMPRESSION: No deep venous thrombosis in the visualized bilateral lower extremities.   Original Report Authenticated By: Charline Bills, M.D.      No diagnosis found.    MDM  Leg swelling and aching with history of PE. Vital stable, no distress, no chest pain or shortness of breath.  Rule out DVT. No tachypnea, tachycardia, hypoxia to suggest PE   Dopplers negative for DVT. INR subtherapeutic at 1.6.  [place] Bridge with Lovenox, followup with Coumadin clinic on Monday. Return to ED for new or worsening symptoms.     Glynn Octave, MD 10/30/11 2108

## 2011-10-30 NOTE — ED Notes (Signed)
Pt reports bilateral leg pain and is concerned with "blood clot".  History of PE in April 2012 and is on Coumadin.  Recent INR was low and medication was adjusted.

## 2012-04-28 ENCOUNTER — Telehealth: Payer: Self-pay | Admitting: Oncology

## 2012-04-28 NOTE — Telephone Encounter (Signed)
Patient reports coming off Coumadin post PE, wanting advice regarding when to have labs rechecked.  Spoke with Dr. Georgina Peer to be off Coumadin 6-8 weeks. Patient informed that [name] will call and schedule according to Dr. Gustavo Lah request. [name], [name] [name]

## 2012-06-25 IMAGING — CT CT ANGIO CHEST
2 of 6 series · 19 of 46 positions shown · IV contrast (APPLIED)
Comparison: None.

CLINICAL DATA: Tightness and shortness of breath last night.
Recent return from [REDACTED].  Elevated D-dimer.

CT ANGIOGRAPHY CHEST WITH CONTRAST
TECHNIQUE: Multidetector CT imaging of the chest was performed
using the standard protocol during bolus administration of
intravenous contrast.  Multiplanar CT image reconstructions
including MIPs were obtained to evaluate the vascular anatomy.
Contrast:  100 ml Omnipaque 300

[Series 8: pulm embolism 1.0 b25f thin · axial · 0.70mm/px · z∈[-232,-6]mm · 16 of 248 slices shown]
[im 11/248  lung]
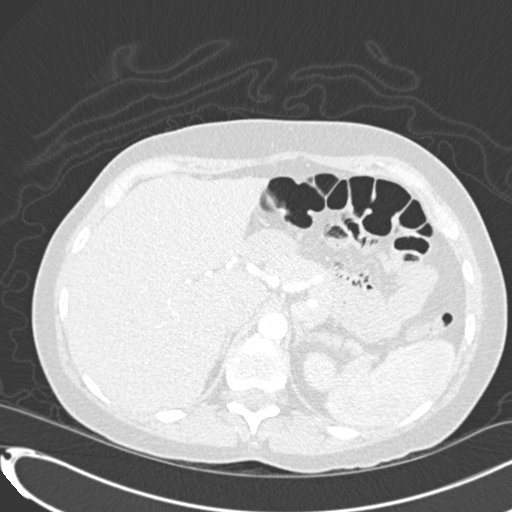
[im 33/248  soft-tissue]
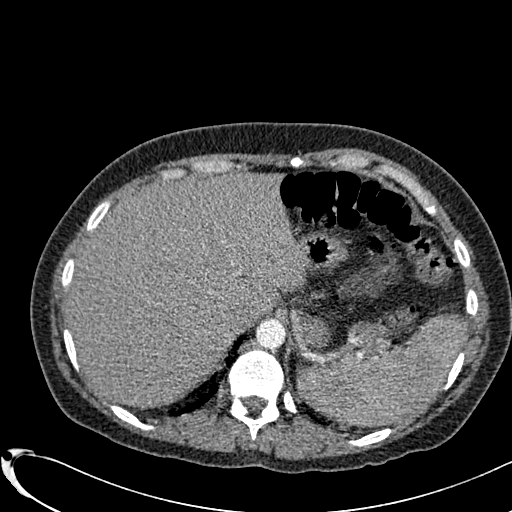
[im 43/248  lung]
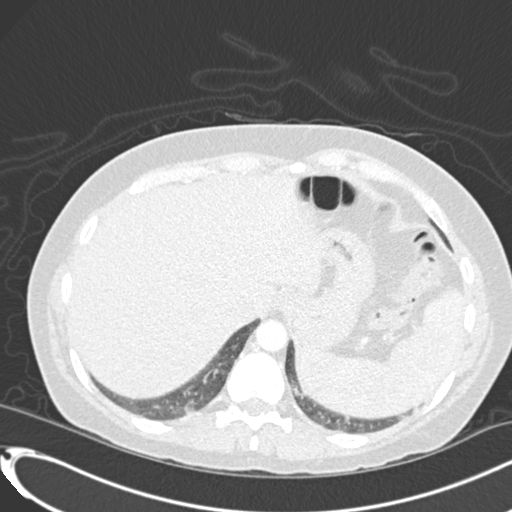
[im 54/248  soft-tissue]
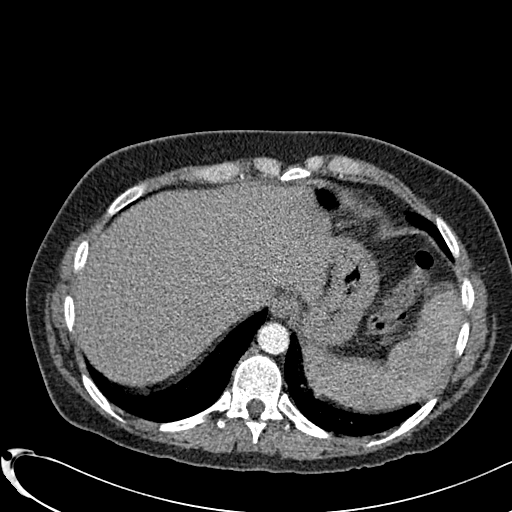
[im 76/248  lung]
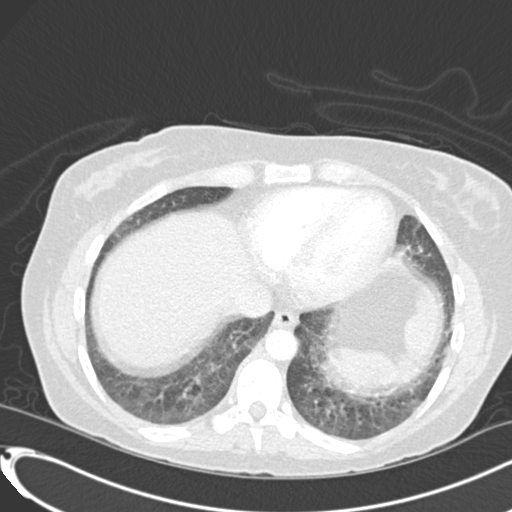
[im 86/248  soft-tissue]
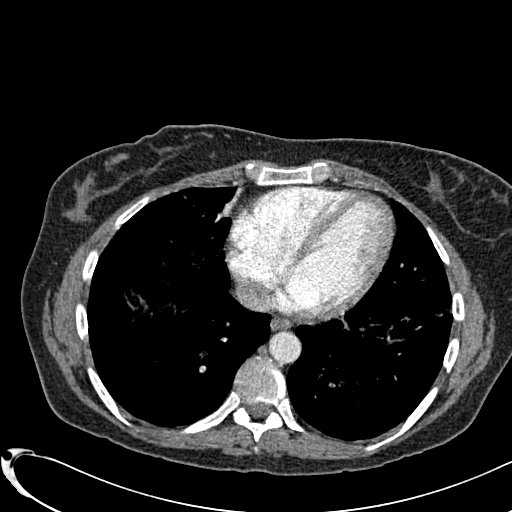
[im 97/248  lung]
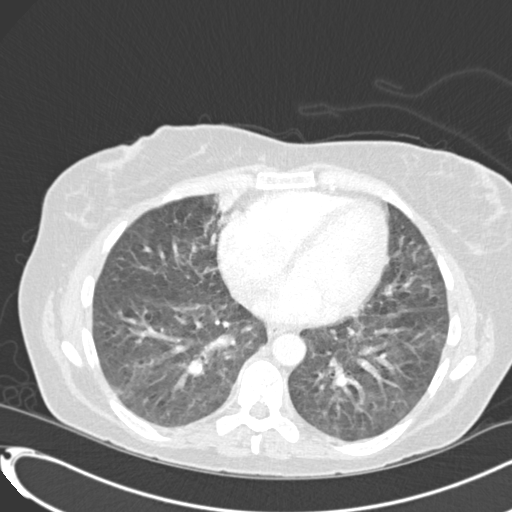
[im 119/248  soft-tissue]
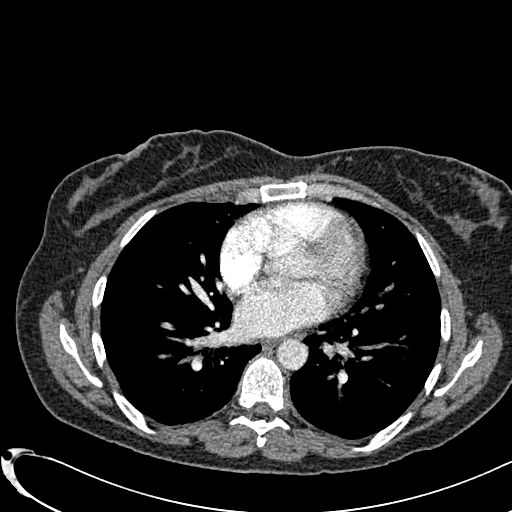
[im 129/248  lung]
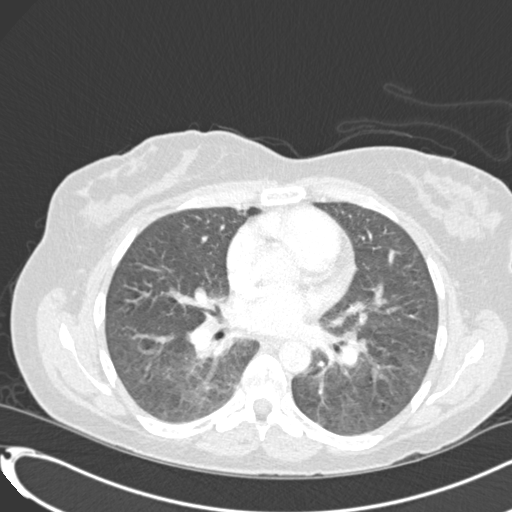
[im 151/248  soft-tissue]
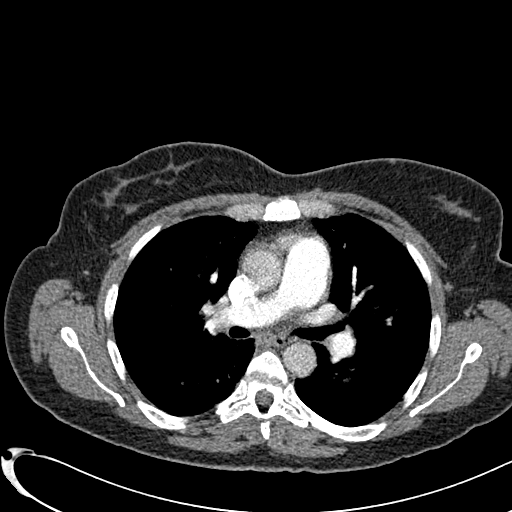
[im 162/248  lung]
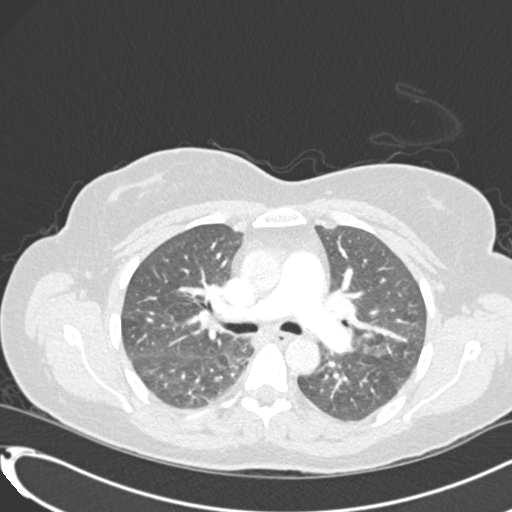
[im 172/248  soft-tissue]
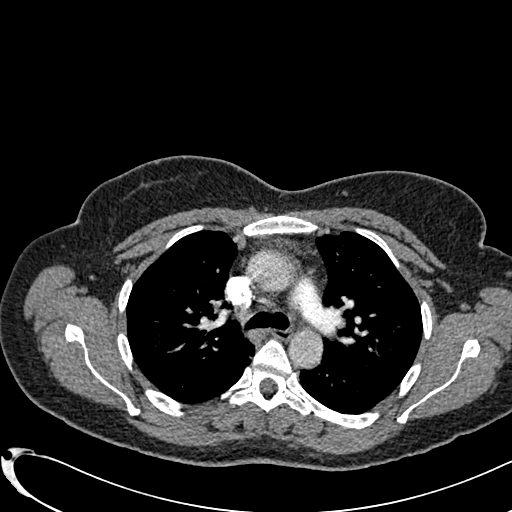
[im 194/248  lung]
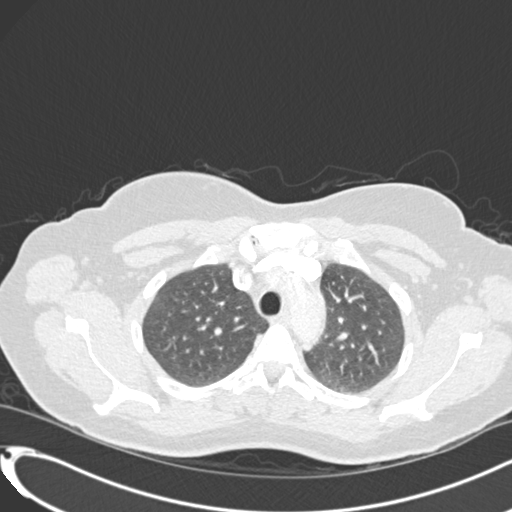
[im 205/248  soft-tissue]
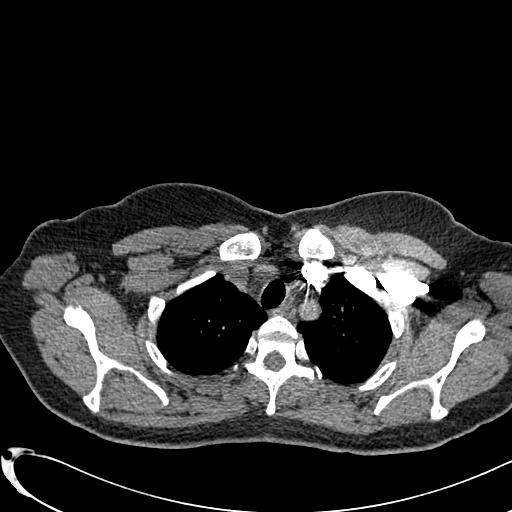
[im 215/248  lung]
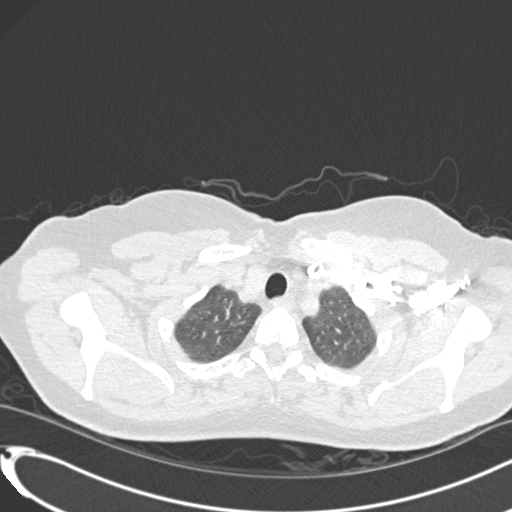
[im 237/248  soft-tissue]
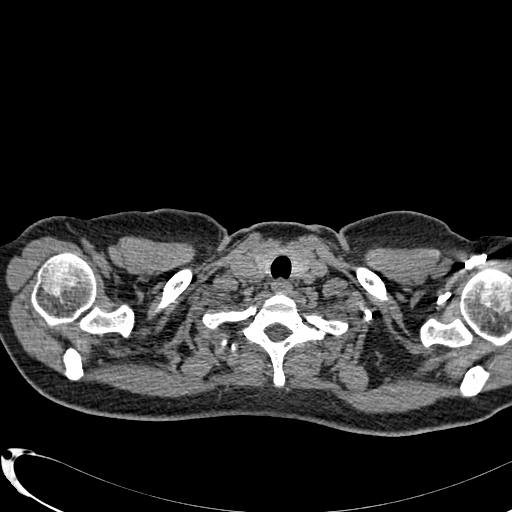

[Series 604: cor mpr · coronal · 0.70mm/px · 3 of 122 slices shown]
[im 31/122  soft-tissue]
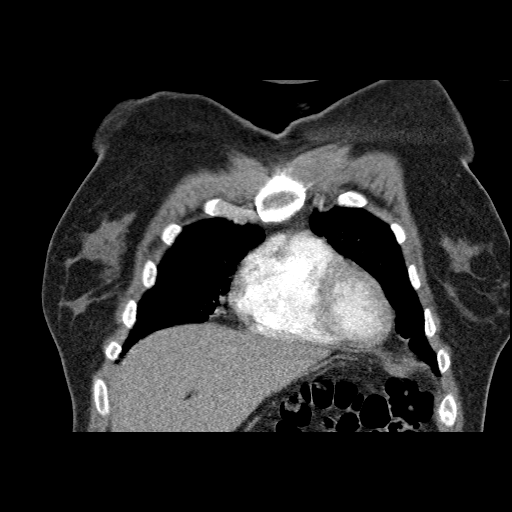
[im 61/122  soft-tissue]
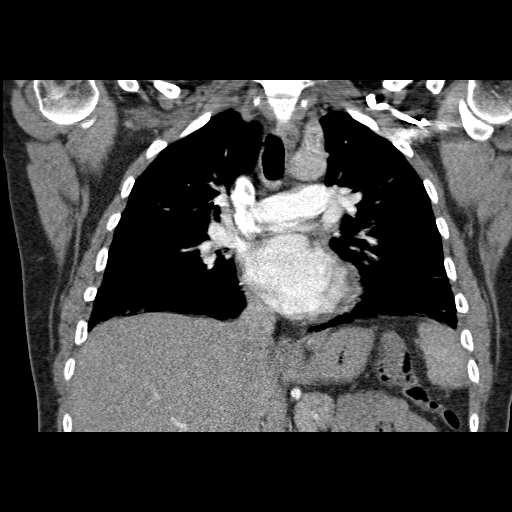
[im 91/122  soft-tissue]
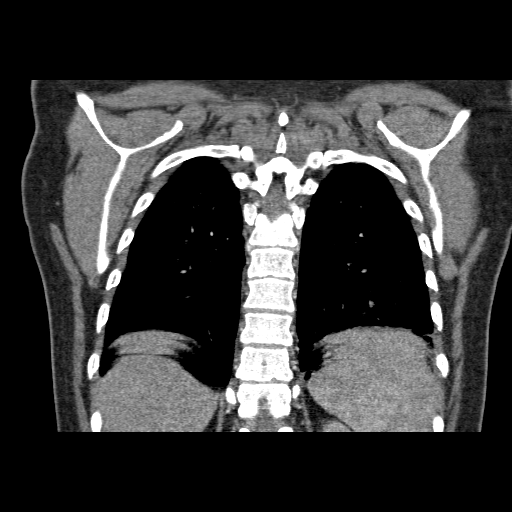

[19 of 46 positions shown; findings below may reference images not displayed]

FINDINGS: There is good opacification of the central and lumbar
pulmonary arteries.  There are small filling defects identified in
the bilateral lower lobe segmental and subsegmental branch vessels
consistent with small peripheral pulmonary emboli.  No central
pulmonary emboli demonstrated.  No pleural effusion.  Visualization
of the lung fields is limited due to motion artifact but no focal
consolidation is suggested.

Review of the MIP images confirms the above findings.
IMPRESSION: Positive study for small peripheral pulmonary emboli in the lower
lungs.

## 2012-10-31 ENCOUNTER — Telehealth: Payer: Self-pay | Admitting: Hematology & Oncology

## 2012-10-31 NOTE — Telephone Encounter (Signed)
Pt called made 9-5 appointment

## 2012-11-11 ENCOUNTER — Other Ambulatory Visit (HOSPITAL_BASED_OUTPATIENT_CLINIC_OR_DEPARTMENT_OTHER): Payer: 59 | Admitting: Lab

## 2012-11-11 ENCOUNTER — Ambulatory Visit (HOSPITAL_BASED_OUTPATIENT_CLINIC_OR_DEPARTMENT_OTHER): Payer: 59 | Admitting: Hematology & Oncology

## 2012-11-11 VITALS — BP 109/69 | HR 62 | Temp 98.6°F | Resp 14 | Ht 65.0 in | Wt 175.0 lb

## 2012-11-11 DIAGNOSIS — I2699 Other pulmonary embolism without acute cor pulmonale: Secondary | ICD-10-CM

## 2012-11-11 NOTE — Progress Notes (Signed)
This office note has been dictated.

## 2012-11-12 NOTE — Progress Notes (Signed)
CC:   Tara Tucker, M.D.  DIAGNOSIS:  Pulmonary emboli - question protein S or protein C deficiency.  CURRENT THERAPY: 1. Patient completed 2 years of Coumadin in July. 2. Patient to start aspirin 162 mg p.o. daily.  INTERIM HISTORY:  Tara Tucker comes in for followup.  We last saw her back in July of 2012.  At that point in time, she was doing well.  She __________ on Coumadin.  She has completed 2 years of Coumadin.  She had a fairly significant pulmonary embolism.  She has been feeling well.  There has been no chest wall pain.  She has had no cough or shortness of breath.  There has been no nausea or vomiting.  There has been no leg swelling.  While on Coumadin, she had no problems with bleeding.  She does not have any monthly cycles because she did have uterine lining ablation.  PHYSICAL EXAMINATION:  General:  This is a well-developed, well- nourished, white female, in no obvious distress.  Vital signs: Temperature of 98.6, pulse 82, respiratory 14, blood pressure 109/69. Weight is 175.  Head and neck:  Normocephalic, atraumatic skull.  There are no ocular or oral lesions.  There are no palpable cervical or supraclavicular lymph nodes.  Lungs:  Clear bilaterally.  Cardiac: Regular rate and rhythm with a normal S1, S2.  There are no murmurs, rubs or bruits.  Abdomen:  Soft.  She has good bowel sounds.  There is no fluid wave.  There is no palpable hepatosplenomegaly.  Extremities: No clubbing, cyanosis, or edema.  No palpable venous cord is noted in either leg.  She has a negative Homans sign.  She has good pulses. Skin:  No rashes, ecchymosis, or petechia.  Neurological:  No focal neurological deficits.  LABORATORY STUDIES:  Pending.  I am checking a protein C and protein S level.  IMPRESSION:  Tara Tucker is very charming 42 year old white female with history of idiopathic pulmonary embolism.  We just are checking to make sure she does not have protein C or protein S  deficiency.  We will go ahead and get her on the aspirin.  I do not think we have to see her back unless she has issues.  She is being followed by Dr. Foy Tucker up in [facility] of family practice, and he is being very diligent with following her along.  Even if she does have a low protein C or protein S level, it would not change my recommendation for her to be on aspirin.    ______________________________ [name], M.D. PRE/MEDQ  D:  11/11/2012  T:  11/12/2012  Job:  [id number]

## 2012-11-14 LAB — PROTEIN S, TOTAL: Protein S Total: 88 % (ref 60–150)

## 2012-11-18 ENCOUNTER — Telehealth: Payer: Self-pay | Admitting: *Deleted

## 2012-11-18 NOTE — Telephone Encounter (Signed)
Message copied by Anselm Jungling on Fri Nov 18, 2012 11:20 AM ------      Message from: Arlan Organ R      Created: Mon Nov 14, 2012  6:29 PM       Call -blood clotting levels are normal!!!  Have a great year!!  Cindee Lame ------

## 2012-11-18 NOTE — Telephone Encounter (Signed)
Called patient to let him know that her blood clotting levels are normal per dr. Myna Hidalgo

## 2013-05-19 ENCOUNTER — Other Ambulatory Visit: Payer: Self-pay | Admitting: Obstetrics and Gynecology

## 2013-05-19 DIAGNOSIS — R928 Other abnormal and inconclusive findings on diagnostic imaging of breast: Secondary | ICD-10-CM

## 2013-05-22 ENCOUNTER — Ambulatory Visit
Admission: RE | Admit: 2013-05-22 | Discharge: 2013-05-22 | Disposition: A | Discharge status: Home / Self Care | Payer: 59 | Source: Ambulatory Visit | Attending: Obstetrics and Gynecology | Admitting: Obstetrics and Gynecology

## 2013-05-22 DIAGNOSIS — R928 Other abnormal and inconclusive findings on diagnostic imaging of breast: Secondary | ICD-10-CM

## 2013-05-30 ENCOUNTER — Other Ambulatory Visit: Payer: 59

## 2014-01-25 ENCOUNTER — Ambulatory Visit (INDEPENDENT_AMBULATORY_CARE_PROVIDER_SITE_OTHER): Payer: Managed Care, Other (non HMO)

## 2014-01-25 VITALS — BP 105/70 | HR 78 | Resp 12

## 2014-01-25 DIAGNOSIS — R52 Pain, unspecified: Secondary | ICD-10-CM

## 2014-01-25 DIAGNOSIS — M779 Enthesopathy, unspecified: Secondary | ICD-10-CM

## 2014-01-25 DIAGNOSIS — M778 Other enthesopathies, not elsewhere classified: Secondary | ICD-10-CM

## 2014-01-25 DIAGNOSIS — M7750 Other enthesopathy of unspecified foot: Secondary | ICD-10-CM

## 2014-01-25 DIAGNOSIS — M2010 Hallux valgus (acquired), unspecified foot: Secondary | ICD-10-CM

## 2014-01-25 NOTE — Patient Instructions (Signed)
Bunionectomy A bunionectomy is surgery to remove a bunion. A bunion is an enlargement of the joint at the base of the big toe. It is made up of bone and soft tissue on the inside part of the joint. Over time, a painful lump appears on the inside of the joint. The big toe begins to point inward toward the second toe. New bone growth can occur and a bone spur may form. The pain eventually causes difficulty walking. A bunion usually results from inflammation caused by the irritation of poorly fitting shoes. It often begins later in life. A bunionectomy is performed when nonsurgical treatment no longer works. When surgery is needed, the extent of the procedure will depend on the degree of deformity of the foot. Your surgeon will discuss with you the different procedures and what will work best for you depending on your age and health. LET YOUR CAREGIVER KNOW ABOUT:   Previous problems with anesthetics or medicines used to numb the skin.  Allergies to dyes, iodine, foods, and/or latex.  Medicines taken including herbs, eye drops, prescription medicines (especially medicines used to "thin the blood"), aspirin and other over-the-counter medicines, and steroids (by mouth or as a cream).  History of bleeding or blood problems.  Possibility of pregnancy, if this applies.  History of blood clots in your legs and/or lungs .  Previous surgery.  Other important health problems. RISKS AND COMPLICATIONS   Infection.  Pain.  Nerve damage.  Possibility that the bunion will recur. BEFORE THE PROCEDURE  You should be present 60 minutes prior to your procedure or as directed.  PROCEDURE  Surgery is often done so that you can go home the same day (outpatient). It may be done in a hospital or in an outpatient surgical center. An anesthetic will be used to help you sleep during the procedure. Sometimes, a spinal anesthetic is used to make you numb below the waist. A cut (incision) is made over the swollen  area at the first joint of the big toe. The enlarged lump will be removed. If there is a need to reposition the bones of the big toe, this may require more than 1 incision. The bone itself may need to be cut. Screws and wires may be used in the repair. These can be removed at a later date. In severe cases, the entire joint may need to be removed and a joint replacement inserted. When done, the incision is closed with stitches (sutures). Skin adhesive strips may be added for reinforcement. They help hold the incision closed.  AFTER THE PROCEDURE  Compression bandages (dressings) are then wrapped around the wound. This helps to keep the foot in alignment and reduce swelling. Your foot will be monitored for bleeding and swelling. You will need to stay for a few hours in the recovery area before being discharged. This allows time for the anesthesia to wear off. You will be discharged home when you are awake, stable, and doing well. HOME CARE INSTRUCTIONS   You can expect to return to normal activities within 6 to 8 weeks after surgery. The foot is at increased risk for swelling for several months. When you can expect to bear weight on the operated foot will depend on the extent of your surgery. The milder the deformity, the less tissue is removed and the sooner the return to normal activity level. During the recovery period, a special shoe, boot, or cast may be worn to accommodate the surgical bandage and to help provide stability   to the foot.  Once you are home, an ice pack applied to the operative site may help with discomfort and keep swelling down. Stop using the ice if it causes discomfort.  Keep your feet raised (elevated) when possible to lessen swelling.  If you have an elastic bandage on your foot and you have numbness, tingling, or your foot becomes cold and blue, adjust the bandage to make it comfortable.  Change dressings as directed.  Keep the wound dry and clean. The wound may be washed  gently with soap and water. Gently blot dry without rubbing. Do not take baths or use swimming pools or hot tubs for 10 days, or as instructed by your caregiver.  Only take over-the-counter or prescription medicines for pain, discomfort, or fever as directed by your caregiver.  You may continue a normal diet as directed.  For activity, use crutches with no weight bearing or your orthopedic shoe as directed. Continue to use crutches or a cane as directed until you can stand without causing pain. SEEK MEDICAL CARE IF:   You have redness, swelling, bruising, or increasing pain in the wound.  There is pus coming from the wound.  You have drainage from a wound lasting longer than 1 day.  You have an oral temperature above 102 F (38.9 C).  You notice a bad smell coming from the wound or dressing.  The wound breaks open after sutures have been removed.  You develop dizzy episodes or fainting while standing.  You have persistent nausea or vomiting.  Your toes become cold.  Pain is not relieved with medicines. SEEK IMMEDIATE MEDICAL CARE IF:   You develop a rash.  You have difficulty breathing.  You develop any reaction or side effects to medicines given.  Your toes are numb or blue, or you have severe pain. MAKE SURE YOU:   Understand these instructions.  Will watch your condition.  Will get help right away if you are not doing well or get worse. Document Released: 02/06/2005 Document Revised: 05/18/2011 Document Reviewed: 03/14/2007 Exit[employer] Patient Information 2015 [employer]. This information is not intended to replace advice given to you by your health care provider. Make sure you discuss any questions you have with your health care provider.  

## 2014-01-25 NOTE — Progress Notes (Signed)
   Subjective:    Patient ID: Tara Tucker, female    DOB: 06/30/1970, 43 y.o.   MRN: [id number]  HPI  PT STATED B/L BUNION HAVING SHARP PAIN GOING UP THE ANKLE FOR 5 YEARS. FEET ARE GETTING WORSE AND GET AGGRAVATED BY WALKING/SITTING. TRIED TO WEAR GOOD SHOES BUT NO HELP.  Review of Systems  All other systems reviewed and are negative.      Objective:   Physical Exam 43 year old white female well-developed well-nourished oriented 3 presents at this time with painful bunions bilateral plain to both feet left more so than right having pain and first MTP joint and also spreading across the metatarsals and up to mid arch as well. Joints and abduct patient is in gait she's tried changing shoes pads cushions all with temporary relief and is interested in finding more about what options as far surgery are available. Lower extremity objective findings as follows vascular status is intact pedal pulses palpable DP +2 PT +2 over 4 bilateral capillary refill time 3 seconds all digits. Epicritic and proprioceptive sensations intact and symmetric bilateral there is normal plantar response DTRs not elicited dermatologic the skin color pigment normal hair growth absent nails somewhat criptotic otherwise unremarkable orthopedic biomechanical exam rectus foot type ankle metatarsal subtalar joint motions normal there is some limitation of motion of the hallux MTP joint left more so than right proxy 60 dorsal flexion on the right about 5045-50 dorsiflexion on the left there is some pain and crepitus on range of motion palpation of the MTP joints. X-rays reveal long first metatarsal sesamoid position 4 IM angle proxy 13 hallux abductus angle 20-25 bilateral with lateral deviation of the hallux noted. Wounds no ulcers no secondary infections noted. 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 Plan at this time dispensed literature about bunions and bunion surgery and correction discussed the risks, occasions alternatives patient is interested however based on her personal and insurance situation may be more beneficial to have surgery in the coming year we'll need to do surgery at least 3 months apart on her 2 feet. At this time again dispensed literature for her to review possible follow-up in 2016 for surgery consult when ready. In the interim did stress the importance of a good stable shoe has stopped wearing high heels however his switch to flats which may actually be causing more damage due to lack of support to the arch recommended things like [place],  [place] comfort in dance toe shoes. No barefoot no flimsy shoes or flip-flops. Follow-up in ready for surgery consult  Alvan Dameichard Xzavian Semmel DPM

## 2014-06-18 ENCOUNTER — Other Ambulatory Visit: Payer: Self-pay | Admitting: Obstetrics and Gynecology

## 2014-06-19 LAB — CYTOLOGY - PAP

## 2014-06-22 ENCOUNTER — Other Ambulatory Visit: Payer: Self-pay | Admitting: Obstetrics and Gynecology

## 2014-06-22 DIAGNOSIS — R928 Other abnormal and inconclusive findings on diagnostic imaging of breast: Secondary | ICD-10-CM

## 2014-06-29 ENCOUNTER — Ambulatory Visit
Admission: RE | Admit: 2014-06-29 | Discharge: 2014-06-29 | Disposition: A | Discharge status: Home / Self Care | Payer: 59 | Source: Ambulatory Visit | Attending: Obstetrics and Gynecology | Admitting: Obstetrics and Gynecology

## 2014-06-29 DIAGNOSIS — R928 Other abnormal and inconclusive findings on diagnostic imaging of breast: Secondary | ICD-10-CM

## 2016-01-13 ENCOUNTER — Ambulatory Visit (INDEPENDENT_AMBULATORY_CARE_PROVIDER_SITE_OTHER): Payer: 59

## 2016-01-13 ENCOUNTER — Encounter: Payer: Self-pay | Admitting: Podiatry

## 2016-01-13 ENCOUNTER — Ambulatory Visit (INDEPENDENT_AMBULATORY_CARE_PROVIDER_SITE_OTHER): Payer: 59 | Admitting: Podiatry

## 2016-01-13 VITALS — BP 103/65 | HR 88 | Resp 16

## 2016-01-13 DIAGNOSIS — M2010 Hallux valgus (acquired), unspecified foot: Secondary | ICD-10-CM | POA: Diagnosis not present

## 2016-01-13 DIAGNOSIS — M79671 Pain in right foot: Secondary | ICD-10-CM

## 2016-01-13 DIAGNOSIS — M21629 Bunionette of unspecified foot: Secondary | ICD-10-CM

## 2016-01-13 DIAGNOSIS — M79672 Pain in left foot: Secondary | ICD-10-CM | POA: Diagnosis not present

## 2016-01-13 NOTE — Patient Instructions (Signed)
Bunionectomy A bunionectomy is a surgical procedure to remove a bunion. A bunion is a visible bump of bone on the inside of your foot where your big toe meets the rest of your foot. A bunion can develop when pressure turns this bone (first metatarsal) toward the other toes. Shoes that are too tight are the most common cause of bunions. Bunions can also be caused by diseases, such as arthritis and polio. You may need a bunionectomy if your bunion is very large and painful or it affects your ability to walk. LET YOUR HEALTH CARE PROVIDER KNOW ABOUT:  Any allergies you have.  All medicines you are taking, including vitamins, herbs, eye drops, creams, and over-the-counter medicines.  Previous problems you or members of your family have had with the use of anesthetics.  Any blood disorders you have.  Previous surgeries you have had.  Medical conditions you have. RISKS AND COMPLICATIONS  Generally, this is a safe procedure. However, problems may occur, including:  Infection.  Pain.  Nerve damage.  Bleeding or blood clots.  Reactions to medicines.  Numbness, stiffness, or arthritis in your toe.  Foot problems that continue even after the procedure. BEFORE THE PROCEDURE  Ask your health care provider about:  Changing or stopping your regular medicines. This is especially important if you are taking diabetes medicines or blood thinners.  Taking medicines such as aspirin and ibuprofen. These medicines can thin your blood. Do not take these medicines before your procedure if your health care provider instructs you not to.  Do not drink alcohol before the procedure as directed by your health care provider.  Do not use tobacco products, including cigarettes, chewing tobacco, or electronic cigarettes, before the procedure as directed by your health care provider. If you need help quitting, ask your health care provider.  Ask your health care provider what kind of medicine you will be  given during your procedure. A bunionectomy may be done using one of these:  A medicine that numbs the area (local anesthetic).  A medicine that makes you go to sleep (general anesthetic). If you will be given general anesthetic, do not eat or drink anything after midnight on the night before the procedure or as directed by your health care provider. PROCEDURE  An IV tube may be inserted into a vein.  You will be given local anesthetic or general anesthetic.  The surgeon will make a cut (incision) over the enlarged area at the first joint of the big toe. The surgeon will remove the bunion.  You may have more than one incision if any of the bones in your big toe need to be moved. A bone itself may need to be cut.  Sometimes the tissues around the big toe may also need to be cut then tightened or loosened to reposition the toe.  Screws or other hardware may be used to keep your foot in thecorrect position.  The incision will be closed with stitches (sutures) and covered with adhesive strips or another type of bandage (dressing). AFTER THE PROCEDURE  You may spend some time in a recovery area.  Your blood pressure, heart rate, breathing rate, and blood oxygen level will be monitored often until the medicines you were given have worn off.   This information is not intended to replace advice given to you by your health care provider. Make sure you discuss any questions you have with your health care provider.   Document Released: 02/06/2005 Document Revised: 11/14/2014 Document Reviewed: 10/11/2013   Elsevier Interactive Patient Education 2016 [employer]  

## 2016-01-13 NOTE — Progress Notes (Signed)
Subjective:     Patient ID: Tara Tucker, female   DOB: [birth year], 45 y.o.   MRN: [id number]  HPI patient presents stating she has a painful bunion deformity on the left over right foot and also on the outside of the left over right foot with difficulty wearing shoe gear comfortably. Has tried wider shoes is tried soaks padding without relief and this is been going on for a number of years   Review of Systems  All other systems reviewed and are negative.      Objective:   Physical Exam  Constitutional: She is oriented to person, place, and time.  Cardiovascular: Intact distal pulses.   Musculoskeletal: Normal range of motion.  Neurological: She is oriented to person, place, and time.  Skin: Skin is warm.  Nursing note and vitals reviewed.  neurovascular status intact muscle strength adequate range of motion within normal limits with patient found to have large hyperostosis medial aspect first metatarsal head left over right with redness upon palpation and pain along with redness and pain fifth metatarsal bilateral. Patient's found to have good range of motion of the first MPJ with no crepitus of the joint and does have mild depression of the arch noted bilateral. 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 Due to long-standing nature problems and failure to respond to conservative care surgical intervention has been recommended consisting of osteotomy first metatarsal and osteotomy fifth metatarsal left with pin and screw fixation. She will have this done in January and we'll have her back in mid January to discuss in greater detail and until then she will continue with shoe gear  modifications  X-ray report indicates elevation of the intermetatarsal angle of approximate 16 with elevation also of the intermetatarsal angle between the fourth and fifth metatarsal mild deviation of hallux left over right

## 2016-03-16 ENCOUNTER — Encounter: Payer: Self-pay | Admitting: Podiatry

## 2016-03-16 ENCOUNTER — Ambulatory Visit: Payer: Self-pay | Admitting: Podiatry

## 2016-03-16 DIAGNOSIS — M2010 Hallux valgus (acquired), unspecified foot: Secondary | ICD-10-CM

## 2016-03-16 DIAGNOSIS — M21629 Bunionette of unspecified foot: Secondary | ICD-10-CM

## 2016-03-16 DIAGNOSIS — R52 Pain, unspecified: Secondary | ICD-10-CM

## 2016-03-16 NOTE — Progress Notes (Signed)
Subjective:     Patient ID: Tara Tucker, female   DOB: 08/22/1970, 46 y.o.   MRN: [id number]  HPI patient presents concerning correction of the left foot stating that the bunion and the fifth metatarsal been sore and making shoe gear increasingly difficult. 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 I recommended correction and I explained procedure and risk and reviewed at great length what we'll be done and all possible complications as outlined on the consent form and discussed alternative treatments. Patient wants surgery signed consent form and is given all preoperative instructions for procedures and also at this time given air fracture walker with instructions on postoperative usage

## 2016-04-21 ENCOUNTER — Encounter: Payer: Self-pay | Admitting: Podiatry

## 2016-04-21 DIAGNOSIS — M21542 Acquired clubfoot, left foot: Secondary | ICD-10-CM | POA: Diagnosis not present

## 2016-04-21 DIAGNOSIS — M2012 Hallux valgus (acquired), left foot: Secondary | ICD-10-CM | POA: Diagnosis not present

## 2016-04-22 ENCOUNTER — Telehealth: Payer: Self-pay | Admitting: *Deleted

## 2016-04-22 NOTE — Telephone Encounter (Addendum)
Pt's husband states leg block has worn off, can pt take oxycodone 10/325 two at a time. I told him she could take one every 4-6 hours, and 800mg  Ibuprofen 3 times a day. I asked Leta JunglingJake have pt to describe the discomfort she was having and pt states like a throbbing. I instructed Jake to remove cam walker, open-ended sock, and ace wrap only, the elevate the surgical foot for 15 minutes, if the throbbing worsened dangle the foot 15 minutes this being the only time it was okay to dangle the foot, then after the 15 minutes place foot level with hip and rewrap the ace from the toes up the leg and replace the sock and surgical boot. I instructed Leta JunglingJake not to let pt be up on the foot or dangling for more than 15 mins/hour and to remain in the surgical boot at all times, and to call with concerns. Leta JunglingJake states understanding.05/07/2016-Pt states a stitch came out on the side of the foot the bunion surgery was done and bleed a little but stopped. I told pt to cover the area with a lightly coated neosporin bandaid and the cover both surgery sites with a gauze to keep the ace from rubbing the areas. Pt states she is still not in the surgery sandal, the surgery boot still feels better, Dr. Charlsie Merlesegal stated to transfer into the surgery sandal when she felt ready. I told pt that once she felt she could go in to the surgery sandal wear it in the house as long as it was comfortable and then switch back to the surgery boot, may switch several times in a day, and to wear the surgery boot outside of the house, with the goal being to be able to wear the surgery sandal all of her work day, and then may begin to transfer to the surgery sandal when walking outside of the house. Pt states understanding and I told her to call with concerns.

## 2016-04-23 ENCOUNTER — Telehealth: Payer: Self-pay

## 2016-04-23 NOTE — Telephone Encounter (Signed)
LVM regarding post op status 

## 2016-04-27 ENCOUNTER — Ambulatory Visit (INDEPENDENT_AMBULATORY_CARE_PROVIDER_SITE_OTHER): Payer: 59

## 2016-04-27 ENCOUNTER — Ambulatory Visit (INDEPENDENT_AMBULATORY_CARE_PROVIDER_SITE_OTHER): Payer: 59 | Admitting: Podiatry

## 2016-04-27 ENCOUNTER — Encounter: Payer: Self-pay | Admitting: Podiatry

## 2016-04-27 VITALS — Temp 98.2°F

## 2016-04-27 DIAGNOSIS — M21629 Bunionette of unspecified foot: Secondary | ICD-10-CM

## 2016-04-27 DIAGNOSIS — M2010 Hallux valgus (acquired), unspecified foot: Secondary | ICD-10-CM

## 2016-04-27 MED ORDER — HYDROCODONE-ACETAMINOPHEN 10-325 MG PO TABS: 1.0000 | ORAL_TABLET | Freq: Three times a day (TID) | ORAL | 0 refills | Status: DC | PRN

## 2016-04-27 NOTE — Progress Notes (Signed)
Subjective:     Patient ID: Tara Tucker, female   DOB: September 10, 1970, 46 y.o.   MRN: [id number]  HPI patient states she is doing well with her left foot with minimal swelling or pain   Review of Systems     Objective:   Physical Exam Neurovascular status intact negative Homan sign was noted with well-healed surgical site right fifth metatarsal and right first metatarsal. The wound edges are well coapted there is moderate edema in the forefoot and patient has been relatively active    Assessment:     Doing well post double osteotomy left with good alignment noted and edema consistent with the postoperative period    Plan:     H&P x-rays reviewed and at this time I reapplied sterile dressing instructed on continued immobilization elevation compression and placed on hydrocodone instead of the oxycodone. Patient be seen back 2 weeks or earlier if needed  X-ray report indicated osteotomies are healing well with screw pins in place and good correction of underlying deformity

## 2016-05-13 ENCOUNTER — Ambulatory Visit (INDEPENDENT_AMBULATORY_CARE_PROVIDER_SITE_OTHER): Payer: 59

## 2016-05-13 ENCOUNTER — Ambulatory Visit (INDEPENDENT_AMBULATORY_CARE_PROVIDER_SITE_OTHER): Payer: 59 | Admitting: Podiatry

## 2016-05-13 DIAGNOSIS — M2010 Hallux valgus (acquired), unspecified foot: Secondary | ICD-10-CM

## 2016-05-13 DIAGNOSIS — M21629 Bunionette of unspecified foot: Secondary | ICD-10-CM | POA: Diagnosis not present

## 2016-05-13 DIAGNOSIS — R600 Localized edema: Secondary | ICD-10-CM

## 2016-05-13 DIAGNOSIS — Z9889 Other specified postprocedural states: Secondary | ICD-10-CM

## 2016-05-16 NOTE — Progress Notes (Signed)
Subjective:     Patient ID: Tara Tucker, female   DOB: 08/13/1970, 46 y.o.   MRN: [id number]  HPI patient states she's doing real well with minimal discomfort or swelling noted   Review of Systems     Objective:   Physical Exam Neurovascular status intact negative Homans sign was noted with wound edges well coapted first and fifth metatarsal left with reduced edema and good alignment with good range of motion of the first MPJ    Assessment:     Doing well post osteotomy first and fifth metatarsal left    Plan:     H&P x-rays reviewed and recommended the continuation of elevation compression and gradual increase in activity levels  X-rays indicate osteotomies are healing well with pin screw in place and no indications of movement

## 2016-06-03 ENCOUNTER — Ambulatory Visit (INDEPENDENT_AMBULATORY_CARE_PROVIDER_SITE_OTHER): Payer: 59 | Admitting: Podiatry

## 2016-06-03 ENCOUNTER — Ambulatory Visit (INDEPENDENT_AMBULATORY_CARE_PROVIDER_SITE_OTHER): Payer: 59

## 2016-06-03 ENCOUNTER — Encounter: Payer: Self-pay | Admitting: Podiatry

## 2016-06-03 VITALS — BP 103/63 | HR 92 | Resp 16

## 2016-06-03 DIAGNOSIS — R600 Localized edema: Secondary | ICD-10-CM

## 2016-06-03 DIAGNOSIS — M21629 Bunionette of unspecified foot: Secondary | ICD-10-CM

## 2016-06-04 NOTE — Progress Notes (Signed)
Subjective:     Patient ID: Mathews RobinsonsWanda C Makki, female   DOB: 14-May-1970, 46 y.o.   MRN: [id number]  HPI patient states she still having some swelling in her left foot but overall she's very satisfied with surgery   Review of Systems     Objective:   Physical Exam Neurovascular status intact negative Homans sign was noted with first MPJ healed well with wound edges well coapted good range of motion and no crepitus within the joint when palpated and did note moderate edema in the forefoot left    Assessment:     Patient continues to exhibit moderate excessive edema but as far as healing goes appears to be doing excellent with the osteotomy    Plan:     H&P conditions reviewed and at this point I did go ahead and I applied an Unna boot to try to reduce swelling [place] and wants to walk as much as possible. I advised her on wide tennis shoes and compression and elevation and she'll be seen back 4 weeks or earlier if needed  X-rays indicate the osteotomy is healing very well with no indications of movement with pins in place

## 2016-07-13 ENCOUNTER — Ambulatory Visit (INDEPENDENT_AMBULATORY_CARE_PROVIDER_SITE_OTHER): Payer: Self-pay | Admitting: Podiatry

## 2016-07-13 ENCOUNTER — Encounter: Payer: Self-pay | Admitting: Podiatry

## 2016-07-13 ENCOUNTER — Ambulatory Visit (INDEPENDENT_AMBULATORY_CARE_PROVIDER_SITE_OTHER): Payer: 59

## 2016-07-13 DIAGNOSIS — M21629 Bunionette of unspecified foot: Secondary | ICD-10-CM

## 2016-07-13 DIAGNOSIS — M2010 Hallux valgus (acquired), unspecified foot: Secondary | ICD-10-CM

## 2016-07-15 NOTE — Progress Notes (Signed)
Subjective:    Patient ID: Tara Tucker, female   DOB: 46 y.o.   MRN: [id number]   HPI patient states she's doing really well overall but still has some swelling at the end of the day    ROS      Objective:  Physical Exam Neurovascular status intact negative Homans sign was noted with wound edges well coapted good alignment and good range of motion    Assessment:   Doing well post osteotomy first fifth metatarsal with wound edges well coapted and alignment good      Plan:    X-ray condition reviewed and advised on continued elevation compression and reappoint again in several weeks  X-ray indicates that the osteotomies are healing with no indications of pathology and good congruous motion noted

## 2016-08-11 ENCOUNTER — Encounter: Payer: Self-pay | Admitting: Podiatry

## 2016-08-11 NOTE — Progress Notes (Signed)
[place] bunionectomy (cutting and moving bone) w/pin fixation (L) 5th met osteotomy w/screw (L)

## 2016-09-02 ENCOUNTER — Ambulatory Visit: Payer: 59 | Admitting: Podiatry

## 2016-09-16 ENCOUNTER — Ambulatory Visit (INDEPENDENT_AMBULATORY_CARE_PROVIDER_SITE_OTHER): Payer: 59

## 2016-09-16 ENCOUNTER — Encounter: Payer: Self-pay | Admitting: Podiatry

## 2016-09-16 ENCOUNTER — Ambulatory Visit (INDEPENDENT_AMBULATORY_CARE_PROVIDER_SITE_OTHER): Payer: 59 | Admitting: Podiatry

## 2016-09-16 VITALS — BP 99/65 | HR 59 | Resp 16

## 2016-09-16 DIAGNOSIS — M21629 Bunionette of unspecified foot: Secondary | ICD-10-CM

## 2016-09-16 DIAGNOSIS — R600 Localized edema: Secondary | ICD-10-CM | POA: Diagnosis not present

## 2016-09-16 NOTE — Progress Notes (Signed)
Subjective:    Patient ID: Tara RobinsonsWanda C Rowser, female   DOB: 46 y.o.   MRN: [id number]   HPI patient states that she still has some swelling on top of her left first metatarsal and has good range of motion but still gets the swelling and she was unsure the cause    ROS      Objective:  Physical Exam neurovascular status intact with patient's left first metatarsal showing fusiform swelling which is fairly consistent across the entire metatarsal with good healing of previous incision site with excellent alignment first MPJ fifth metatarsal from previous surgery 5 months ago     Assessment:   Difficult to determine cause but may be just localized swelling may be inflammatory process or could be possible metal allergy     Plan:    H&P x-rays reviewed all conditions discussed. At this point we'll use aggressive ice and compression therapy and give it several more months and if symptoms persist consider removal of pins. Patient will be seen back in 3 months or as needed and will work and compression over that period of time  X-rays indicate osteotomy is healing well pins are in place joint congruence

## 2016-12-31 DIAGNOSIS — I2699 Other pulmonary embolism without acute cor pulmonale: Secondary | ICD-10-CM | POA: Insufficient documentation

## 2017-06-03 ENCOUNTER — Other Ambulatory Visit: Payer: Self-pay | Admitting: Obstetrics and Gynecology

## 2017-06-03 DIAGNOSIS — Z803 Family history of malignant neoplasm of breast: Secondary | ICD-10-CM

## 2017-07-10 ENCOUNTER — Ambulatory Visit
Admission: RE | Admit: 2017-07-10 | Discharge: 2017-07-10 | Disposition: A | Discharge status: Home / Self Care | Payer: Self-pay | Source: Ambulatory Visit | Attending: Obstetrics and Gynecology | Admitting: Obstetrics and Gynecology

## 2017-07-10 DIAGNOSIS — Z803 Family history of malignant neoplasm of breast: Secondary | ICD-10-CM

## 2017-07-10 MED ORDER — GADOBENATE DIMEGLUMINE 529 MG/ML IV SOLN
17.0000 mL | Freq: Once | INTRAVENOUS | Status: AC | PRN
  Administered 2017-07-10: 17 mL via INTRAVENOUS

## 2018-03-16 DIAGNOSIS — Z9851 Tubal ligation status: Secondary | ICD-10-CM | POA: Insufficient documentation

## 2018-04-08 ENCOUNTER — Ambulatory Visit (INDEPENDENT_AMBULATORY_CARE_PROVIDER_SITE_OTHER): Payer: 59 | Admitting: Podiatry

## 2018-04-08 ENCOUNTER — Ambulatory Visit (INDEPENDENT_AMBULATORY_CARE_PROVIDER_SITE_OTHER): Payer: 59

## 2018-04-08 ENCOUNTER — Encounter: Payer: Self-pay | Admitting: Podiatry

## 2018-04-08 DIAGNOSIS — T8484XA Pain due to internal orthopedic prosthetic devices, implants and grafts, initial encounter: Secondary | ICD-10-CM

## 2018-04-08 DIAGNOSIS — M779 Enthesopathy, unspecified: Secondary | ICD-10-CM

## 2018-04-08 DIAGNOSIS — S9031XA Contusion of right foot, initial encounter: Secondary | ICD-10-CM

## 2018-04-08 MED ORDER — TRIAMCINOLONE ACETONIDE 10 MG/ML IJ SUSP
10.0000 mg | Freq: Once | INTRAMUSCULAR | Status: AC
  Administered 2018-04-08: 10 mg

## 2018-04-08 NOTE — Progress Notes (Signed)
Subjective:   Patient ID: Tara Tucker, female   DOB: 48 y.o.   MRN: [id number]   HPI Patient states she fell a week ago and hurt her big toe joint right and she is had a prominence on her left first metatarsal that is bothersome with shoes and she would like it looked at with consideration for removal.  Patient states the bunion correction left is doing extremely well she is very pleased   ROS      Objective:  Physical Exam  Neurovascular status intact with inflammation of the first MPJ right with moderate structural bunion and limitation of motion and on the left there is a prominent pin position left first metatarsal proximal pin     Assessment:  Abnormal pin position left first metatarsal with capsulitis of the first MPJ right     Plan:  H&P reviewed both conditions and discussed pin removal which she wants done.  Allowed her to read consent form going over risk and she understands risk and signed consent form after review and is scheduled for office outpatient surgery.  For the right I did go ahead did a sterile prep and injected the first MPJ periarticular 3 mg Kenalog 5 mg Xylocaine advised on rigid bottom shoes and reappoint if symptoms persist and may require bunion correction  X-ray indicates that the pin is moderately prominent in the left first metatarsal with excellent correction of the bunion and the right shows moderate structural bunion with no indications of acute process

## 2018-05-04 ENCOUNTER — Encounter: Payer: Self-pay | Admitting: Podiatry

## 2018-05-04 ENCOUNTER — Ambulatory Visit (INDEPENDENT_AMBULATORY_CARE_PROVIDER_SITE_OTHER): Payer: 59 | Admitting: Podiatry

## 2018-05-04 VITALS — BP 117/74 | HR 83 | Temp 98.7°F | Resp 16

## 2018-05-04 DIAGNOSIS — T8484XA Pain due to internal orthopedic prosthetic devices, implants and grafts, initial encounter: Secondary | ICD-10-CM | POA: Diagnosis not present

## 2018-05-04 NOTE — Progress Notes (Signed)
Subjective:   Patient ID: Tara Tucker, female   DOB: 48 y.o.   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  Patient ejected 6 mg Xylocaine Marcaine mixture and the patient's left foot was prepped and draped utilizing standard aseptic technique.  Patient had incision made over the prominent pin position and utilizing sharp and blunt dissection it was taken down to capsule with hemostasis being acquired as necessary.  Small capsule incision was made capsular tissue sharply dissected off the underlying bone and the pin was exposed and removed in toto.  The wound was flushed with copious mustard Romycin solution and sutured with 5-0 nylon.  Sterile dressing applied and patient tolerated procedure well and tourniquet was released capillary fill is not immediate to all digits on the toe and patient left the OR in satisfactory condition will be seen back 2 weeks

## 2018-05-18 ENCOUNTER — Other Ambulatory Visit: Payer: 59

## 2018-05-19 ENCOUNTER — Ambulatory Visit (INDEPENDENT_AMBULATORY_CARE_PROVIDER_SITE_OTHER): Payer: 59

## 2018-05-19 ENCOUNTER — Ambulatory Visit (INDEPENDENT_AMBULATORY_CARE_PROVIDER_SITE_OTHER): Payer: Self-pay | Admitting: Podiatry

## 2018-05-19 ENCOUNTER — Encounter: Payer: Self-pay | Admitting: Podiatry

## 2018-05-19 ENCOUNTER — Other Ambulatory Visit: Payer: Self-pay

## 2018-05-19 DIAGNOSIS — T8484XA Pain due to internal orthopedic prosthetic devices, implants and grafts, initial encounter: Secondary | ICD-10-CM

## 2018-05-23 NOTE — Progress Notes (Signed)
Subjective:   Patient ID: Tara Tucker, female   DOB: 48 y.o.   MRN: [id number]   HPI Patient states doing fine and I did go on a trip did not have problems   ROS      Objective:  Physical Exam  Neurovascular status intact with incision site left first metatarsal healing well wound edges well coapted no indications of pathology     Assessment:  Doing well post pin removal left with stitches intact     Plan:  X-ray reviewed stitches removed sterile dressing reapplied and continue sterile dressing for the next week and reappoint to reevaluate as needed  X-ray indicates satisfactory removal of fixation with good alignment noted the joint surface

## 2018-11-30 ENCOUNTER — Other Ambulatory Visit: Payer: Self-pay | Admitting: Obstetrics and Gynecology

## 2018-11-30 DIAGNOSIS — Z9189 Other specified personal risk factors, not elsewhere classified: Secondary | ICD-10-CM

## 2019-01-11 ENCOUNTER — Other Ambulatory Visit: Payer: Self-pay

## 2019-01-11 ENCOUNTER — Ambulatory Visit
Admission: RE | Admit: 2019-01-11 | Discharge: 2019-01-11 | Disposition: A | Discharge status: Home / Self Care | Payer: 59 | Source: Ambulatory Visit | Attending: Obstetrics and Gynecology | Admitting: Obstetrics and Gynecology

## 2019-01-11 DIAGNOSIS — Z9189 Other specified personal risk factors, not elsewhere classified: Secondary | ICD-10-CM

## 2019-01-11 MED ORDER — GADOBUTROL 1 MMOL/ML IV SOLN
7.0000 mL | Freq: Once | INTRAVENOUS | Status: AC | PRN
  Administered 2019-01-11: 7 mL via INTRAVENOUS

## 2020-01-18 ENCOUNTER — Other Ambulatory Visit: Payer: Self-pay | Admitting: Obstetrics and Gynecology

## 2020-01-18 DIAGNOSIS — Z9189 Other specified personal risk factors, not elsewhere classified: Secondary | ICD-10-CM

## 2020-02-07 ENCOUNTER — Telehealth: Payer: Self-pay | Admitting: Family

## 2020-02-07 NOTE — Telephone Encounter (Signed)
Called and spoke with patient regarding new patient appointment.  She was ok with both date/time

## 2020-02-27 ENCOUNTER — Other Ambulatory Visit: Payer: Self-pay | Admitting: Family

## 2020-02-28 ENCOUNTER — Inpatient Hospital Stay: Payer: BC Managed Care – PPO

## 2020-02-28 ENCOUNTER — Other Ambulatory Visit: Payer: Self-pay

## 2020-02-28 ENCOUNTER — Telehealth: Payer: Self-pay | Admitting: Family

## 2020-02-28 ENCOUNTER — Encounter: Payer: Self-pay | Admitting: Family

## 2020-02-28 ENCOUNTER — Telehealth: Payer: Self-pay | Admitting: *Deleted

## 2020-02-28 ENCOUNTER — Encounter (INDEPENDENT_AMBULATORY_CARE_PROVIDER_SITE_OTHER): Payer: Self-pay

## 2020-02-28 ENCOUNTER — Inpatient Hospital Stay: Payer: BC Managed Care – PPO | Attending: Family | Admitting: Family

## 2020-02-28 ENCOUNTER — Other Ambulatory Visit: Payer: Self-pay | Admitting: Family

## 2020-02-28 DIAGNOSIS — Z86711 Personal history of pulmonary embolism: Secondary | ICD-10-CM | POA: Diagnosis not present

## 2020-02-28 DIAGNOSIS — R5383 Other fatigue: Secondary | ICD-10-CM | POA: Insufficient documentation

## 2020-02-28 DIAGNOSIS — R531 Weakness: Secondary | ICD-10-CM | POA: Diagnosis not present

## 2020-02-28 DIAGNOSIS — R232 Flushing: Secondary | ICD-10-CM | POA: Diagnosis not present

## 2020-02-28 LAB — CBC WITH DIFFERENTIAL (CANCER CENTER ONLY)
Abs Immature Granulocytes: 0.03 10*3/uL (ref 0.00–0.07)
Basophils Absolute: 0 10*3/uL (ref 0.0–0.1)
Basophils Relative: 1 %
Eosinophils Absolute: 0.3 10*3/uL (ref 0.0–0.5)
Eosinophils Relative: 5 %
HCT: 39.7 % (ref 36.0–46.0)
Hemoglobin: 13 g/dL (ref 12.0–15.0)
Immature Granulocytes: 1 %
Lymphocytes Relative: 28 %
Lymphs Abs: 1.4 10*3/uL (ref 0.7–4.0)
MCH: 32.5 pg (ref 26.0–34.0)
MCHC: 32.7 g/dL (ref 30.0–36.0)
MCV: 99.3 fL (ref 80.0–100.0)
Monocytes Absolute: 0.3 10*3/uL (ref 0.1–1.0)
Monocytes Relative: 6 %
Neutro Abs: 3.1 10*3/uL (ref 1.7–7.7)
Neutrophils Relative %: 59 %
Platelet Count: 236 10*3/uL (ref 150–400)
RBC: 4 MIL/uL (ref 3.87–5.11)
RDW: 12 % (ref 11.5–15.5)
WBC Count: 5.2 10*3/uL (ref 4.0–10.5)
nRBC: 0 % (ref 0.0–0.2)

## 2020-02-28 LAB — CMP (CANCER CENTER ONLY)
ALT: 15 U/L (ref 0–44)
AST: 15 U/L (ref 15–41)
Albumin: 4.4 g/dL (ref 3.5–5.0)
Alkaline Phosphatase: 37 U/L — ABNORMAL LOW (ref 38–126)
Anion gap: 7 (ref 5–15)
BUN: 16 mg/dL (ref 6–20)
CO2: 28 mmol/L (ref 22–32)
Calcium: 9.6 mg/dL (ref 8.9–10.3)
Chloride: 109 mmol/L (ref 98–111)
Creatinine: 0.68 mg/dL (ref 0.44–1.00)
GFR, Estimated: >60 mL/min (ref >60)
Glucose, Bld: 98 mg/dL (ref 70–99)
Potassium: 4.5 mmol/L (ref 3.5–5.1)
Sodium: 144 mmol/L (ref 135–145)
Total Bilirubin: 0.4 mg/dL (ref 0.3–1.2)
Total Protein: 7 g/dL (ref 6.5–8.1)

## 2020-02-28 LAB — IRON AND TIBC
Iron: 108 ug/dL (ref 41–142)
Saturation Ratios: 42 % (ref 21–57)
TIBC: 258 ug/dL (ref 236–444)
UIBC: 150 ug/dL (ref 120–384)

## 2020-02-28 LAB — FERRITIN: Ferritin: 135 ng/mL (ref 11–307)

## 2020-02-28 LAB — LACTATE DEHYDROGENASE: LDH: 131 U/L (ref 98–192)

## 2020-02-28 NOTE — Progress Notes (Signed)
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  Iron studies are pending.  She had been feeling fatigued, weak and noted an increase in her joint aches and pains. Dr. Renaldo Fiddler was working her up for possible anemia which she had in the past with heavy cycles.  She had a uterine ablation approximately 10 years ago and has not had a cycle since.  She has history of pulmonary emboli in 2012 and treated with 2 years of Coumadin followed by several years of 2 baby aspirin PO daily. She is not on anything at this time and has had no other episodes.  She had low total protein C as well as low protein S total and activity in 2014. This resolved on recheck in 2016 to within normal limits. Hyper coag work up was otherwise negative.  She has occasional hot flashes and night sweats.  She notes occasional positional numbness and tingling in her hands.  No swelling in her extremities.  No falls or syncope.  No fever, chills, n/v, cough, rash, dizziness, SOB, chest pain, palpitations, abdominal pain or changes in bowel or bladder habits.  No known personal or familial liver or heart disease.  No history of diabetes or thyroid disease.  She has a strong family history of breast cancer (several maternal aunts) and alternates mammogram and breast MRI annually.  No personal history of cancer. Family history also includes maternal aunt and cousin with ovarian and another aunt with uterine.  She has maintained a good appetite and is staying well hydrated throughout the day. Her weight is described  as stable.  No smoking or recreational drug use.  She has the occasional alcoholic beverage socially.  She works in [employer].   ROS: All other 10 point review of systems is negative.   PAST MEDICAL HISTORY:   Past Medical History:  Diagnosis Date  . History of blood clots   . Pulmonary emboli (HCC)     ALLERGIES: No Known Allergies    MEDICATIONS:  No current outpatient medications on file prior to visit.   No current facility-administered medications on file prior to visit.     PAST SURGICAL HISTORY Past Surgical History:  Procedure Laterality Date  . CESAREAN SECTION      FAMILY HISTORY: No family history on file.  SOCIAL HISTORY:  reports that she has never smoked. She has never used smokeless tobacco. She reports current alcohol use. She reports that she does not use drugs.  PERFORMANCE STATUS: The patient's performance status is 1 - Symptomatic but completely ambulatory  PHYSICAL EXAM: Most Recent Vital Signs: There were no vitals taken for this visit. There were no vitals taken for this visit.  General Appearance:    Alert, cooperative, no distress, appears stated age  Head:    Normocephalic, without obvious abnormality, atraumatic  Eyes:    PERRL, conjunctiva/corneas clear, EOM's intact, fundi    benign, both eyes        Throat:   Lips, mucosa, and tongue normal; teeth and gums normal  Neck:   Supple, symmetrical, trachea midline, no adenopathy;  thyroid:  no enlargement/tenderness/nodules; no carotid   bruit or JVD  Back:     Symmetric, no curvature, ROM normal, no CVA tenderness  Lungs:     Clear to auscultation bilaterally, respirations unlabored  Chest Wall:    No tenderness or deformity   Heart:    Regular rate and rhythm, S1 and S2 normal, no murmur, rub   or gallop     Abdomen:     Soft, non-tender, bowel sounds active all four quadrants,    no masses, no organomegaly        Extremities:   Extremities normal, atraumatic, no  cyanosis or edema  Pulses:   2+ and symmetric all extremities  Skin:   Skin color, texture, turgor normal, no rashes or lesions  Lymph nodes:   Cervical, supraclavicular, and axillary nodes normal  Neurologic:   CNII-XII intact, normal strength, sensation and reflexes    throughout    LABORATORY DATA:  Results for orders placed or performed in visit on 02/28/20 (from the past 48 hour(s))  CBC with Differential (Cancer Center Only)     Status: None   Collection Time: 02/28/20  8:51 AM  Result Value Ref Range   WBC Count 5.2 4.0 - 10.5 K/uL   RBC 4.00 3.87 - 5.11 MIL/uL   Hemoglobin 13.0 12.0 - 15.0 g/dL   HCT 16.1 09.6 - 04.5 %   MCV 99.3 80.0 - 100.0 fL   MCH 32.5 26.0 - 34.0 pg   MCHC 32.7 30.0 - 36.0 g/dL   RDW 40.9 81.1 - 91.4 %   Platelet Count 236 150 - 400 K/uL   nRBC 0.0 0.0 - 0.2 %   Neutrophils Relative % 59 %   Neutro Abs 3.1 1.7 - 7.7 K/uL   Lymphocytes Relative 28 %   Lymphs Abs 1.4 0.7 - 4.0 K/uL   Monocytes Relative 6 %   Monocytes Absolute 0.3 0.1 - 1.0 K/uL   Eosinophils Relative 5 %   Eosinophils Absolute 0.3 0.0 - 0.5 K/uL   Basophils Relative 1 %   Basophils Absolute 0.0 0.0 - 0.1 K/uL   Immature Granulocytes 1 %   Abs Immature Granulocytes 0.03 0.00 - 0.07 K/uL    Comment: Performed at [facility] at [facility], [address], [place], [place] [address]      RADIOGRAPHY: No results found.     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  Iron studies are pending. We will see if she needs to be phlebotomized and set her up if needed.  We will also get a baseline US of the liver. Follow-up in 3 months.  All questions were answered. She can contact our office with any questions or concerns. We can certainly see her sooner if needed.   Emeline Gins, NP

## 2020-02-28 NOTE — Telephone Encounter (Signed)
I called and spoke with patient regarding appointments added per 12/22 los.  I also sent her a link to sign up for My Chart Access

## 2020-02-28 NOTE — Telephone Encounter (Signed)
As noted below by Emeline Gins, NP, I informed the patient that her ferritin is above 100. You will need to do one phlebotomy. She prefers to have the phlebotomy done here at the [facility]. LOS sent to scheduling. She verbalized understanding.

## 2020-02-28 NOTE — Telephone Encounter (Signed)
-----   Message from Verdie Mosher, NP sent at 02/28/2020  3:24 PM EST ----- Ferritin is above 100. We will do one phlebotomy. Would she like to donate or come here? Let me know and I will do the orders.     ----- Message ----- From: Leory Plowman, [facility] [place] Sent: 02/28/2020   9:04 AM EST To: Verdie Mosher, NP

## 2020-03-05 ENCOUNTER — Ambulatory Visit (HOSPITAL_BASED_OUTPATIENT_CLINIC_OR_DEPARTMENT_OTHER)
Admission: RE | Admit: 2020-03-05 | Discharge: 2020-03-05 | Disposition: A | Discharge status: Home / Self Care | Payer: BC Managed Care – PPO | Source: Ambulatory Visit | Attending: Family | Admitting: Family

## 2020-03-05 ENCOUNTER — Other Ambulatory Visit: Payer: Self-pay

## 2020-03-05 ENCOUNTER — Inpatient Hospital Stay: Payer: BC Managed Care – PPO

## 2020-03-05 ENCOUNTER — Other Ambulatory Visit: Payer: Self-pay | Admitting: Family

## 2020-03-05 NOTE — Patient Instructions (Signed)

## 2020-03-05 NOTE — Progress Notes (Signed)
Tara Tucker presents today for phlebotomy per MD orders. Phlebotomy procedure started at 1140 and ended at 1150. 516 cc removed via 16 G needle at [facility] site. Patient tolerated procedure well. Pt discharged in no apparent distress. Pt left ambulatory without assistance. Pt aware of discharge instructions and verbalized understanding and had no further questions. Stable and Asymptomatic upon discharge.

## 2020-03-06 ENCOUNTER — Encounter: Payer: Self-pay | Admitting: *Deleted

## 2020-05-28 ENCOUNTER — Inpatient Hospital Stay: Payer: BC Managed Care – PPO | Attending: Family

## 2020-05-28 ENCOUNTER — Encounter: Payer: Self-pay | Admitting: Family

## 2020-05-28 ENCOUNTER — Other Ambulatory Visit: Payer: Self-pay

## 2020-05-28 ENCOUNTER — Inpatient Hospital Stay (HOSPITAL_BASED_OUTPATIENT_CLINIC_OR_DEPARTMENT_OTHER): Payer: BC Managed Care – PPO | Admitting: Family

## 2020-05-28 ENCOUNTER — Telehealth: Payer: Self-pay | Admitting: *Deleted

## 2020-05-28 LAB — CMP (CANCER CENTER ONLY)
ALT: 12 U/L (ref 0–44)
AST: 15 U/L (ref 15–41)
Albumin: 4.4 g/dL (ref 3.5–5.0)
Alkaline Phosphatase: 39 U/L (ref 38–126)
Anion gap: 8 (ref 5–15)
BUN: 12 mg/dL (ref 6–20)
CO2: 29 mmol/L (ref 22–32)
Calcium: 9.2 mg/dL (ref 8.9–10.3)
Chloride: 103 mmol/L (ref 98–111)
Creatinine: 0.73 mg/dL (ref 0.44–1.00)
GFR, Estimated: >60 mL/min (ref >60)
Glucose, Bld: 99 mg/dL (ref 70–99)
Potassium: 3.7 mmol/L (ref 3.5–5.1)
Sodium: 140 mmol/L (ref 135–145)
Total Bilirubin: 0.6 mg/dL (ref 0.3–1.2)
Total Protein: 7 g/dL (ref 6.5–8.1)

## 2020-05-28 LAB — FERRITIN: Ferritin: 99 ng/mL (ref 11–307)

## 2020-05-28 LAB — CBC WITH DIFFERENTIAL (CANCER CENTER ONLY)
Abs Immature Granulocytes: 0.01 10*3/uL (ref 0.00–0.07)
Basophils Absolute: 0 10*3/uL (ref 0.0–0.1)
Basophils Relative: 1 %
Eosinophils Absolute: 0.4 10*3/uL (ref 0.0–0.5)
Eosinophils Relative: 7 %
HCT: 39.5 % (ref 36.0–46.0)
Hemoglobin: 13.2 g/dL (ref 12.0–15.0)
Immature Granulocytes: 0 %
Lymphocytes Relative: 31 %
Lymphs Abs: 1.7 10*3/uL (ref 0.7–4.0)
MCH: 32.5 pg (ref 26.0–34.0)
MCHC: 33.4 g/dL (ref 30.0–36.0)
MCV: 97.3 fL (ref 80.0–100.0)
Monocytes Absolute: 0.4 10*3/uL (ref 0.1–1.0)
Monocytes Relative: 7 %
Neutro Abs: 3 10*3/uL (ref 1.7–7.7)
Neutrophils Relative %: 54 %
Platelet Count: 244 10*3/uL (ref 150–400)
RBC: 4.06 MIL/uL (ref 3.87–5.11)
RDW: 12.3 % (ref 11.5–15.5)
WBC Count: 5.6 10*3/uL (ref 4.0–10.5)
nRBC: 0 % (ref 0.0–0.2)

## 2020-05-28 LAB — IRON AND TIBC
Iron: 137 ug/dL (ref 41–142)
Saturation Ratios: 54 % (ref 21–57)
TIBC: 255 ug/dL (ref 236–444)
UIBC: 118 ug/dL — ABNORMAL LOW (ref 120–384)

## 2020-05-28 NOTE — Telephone Encounter (Signed)
No 05/28/20 los to be scheduled. 

## 2020-05-28 NOTE — Progress Notes (Signed)
Hematology and Oncology Follow Up Visit  Tara OVENS [id number] [birth year] 50 y.o. 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 She is doing well but still notes fatigue.  She does not sleep well at night waking up easily she feels due to stress.  Most recent phlebotomy was in December. Iron saturation at that time was 42% and ferritin 135.  No fever, chills, n/v, cough, rash, dizziness, SOB, chest pain, palpitations, abdominal pain or changes in bowel or bladder habits.  No blood loss noted. She still notes that she bruises easily. No petechiae.  No swelling, tenderness, numbness or tingling in her extremities at this time. She has occasional numbness and tingling in her hands which seems to be positional when she is sedentary.  No falls or syncope to report.  She has maintained a good appetite and is staying well hydrated throughout the day.   ECOG Performance Status: 1 - Symptomatic but completely ambulatory  Medications:  Allergies as of 05/28/2020   No Known Allergies     Medication List    as of May 28, 2020  8:44 AM   You have not been prescribed any medications.     Allergies: No Known Allergies  Past Medical History, Surgical history, Social history, and Family History were reviewed and updated.  Review of Systems: All other 10 point review of systems is negative.   Physical Exam:  vitals were not taken for this visit.   Wt Readings from Last 3 Encounters:  02/28/20 186 lb 1.9 oz (84.4 kg)  11/11/12 175 lb (79.4 kg)  10/30/11 160 lb (72.6 kg)    Ocular: Sclerae unicteric, pupils equal, round and reactive to light Ear-nose-throat: Oropharynx clear, dentition fair Lymphatic: No cervical or supraclavicular  adenopathy Lungs no rales or rhonchi, good excursion bilaterally Heart regular rate and rhythm, no murmur appreciated Abd soft, nontender, positive bowel sounds MSK no focal spinal tenderness, no joint edema Neuro: non-focal, well-oriented, appropriate affect Breasts: Deferred   Lab Results  Component Value Date   WBC 5.6 05/28/2020   HGB 13.2 05/28/2020   HCT 39.5 05/28/2020   MCV 97.3 05/28/2020   PLT 244 05/28/2020   Lab Results  Component Value Date   FERRITIN 135 02/28/2020   IRON 108 02/28/2020   TIBC 258 02/28/2020   UIBC 150 02/28/2020   IRONPCTSAT 42 02/28/2020   Lab Results  Component Value Date   RBC 4.06 05/28/2020   No results found for: KPAFRELGTCHN, LAMBDASER, KAPLAMBRATIO No results found for: IGGSERUM, IGA, IGMSERUM No results found for: Marda Stalker, SPEI   Chemistry      Component Value Date/Time   NA 144 02/28/2020 0851   K 4.5 02/28/2020 0851   CL 109 02/28/2020 0851   CO2 28 02/28/2020 0851   BUN 16 02/28/2020 0851   CREATININE 0.68 02/28/2020 0851      Component Value Date/Time   CALCIUM 9.6 02/28/2020 0851   ALKPHOS 37 (L) 02/28/2020 0851   AST 15 02/28/2020 0851   ALT 15 02/28/2020 0851   BILITOT 0.4 02/28/2020 0851       Impression and Plan: Ms. Snader is a very pleasant 50 yo caucasian female with recent diagnosis of hereditary  hemochromatosis, heterozygous for the C282Y mutation.  She also has history of PE in 2012 and had low protein c total and low protein s total and activity. So far, there has been no evidence of recurrence.  Iron studies are pending. We we will get her set up for phlebotomy if needed.  Follow-up in 3 months.  She was encouraged to contact our office with any questions or concerns.   Emeline Gins, NP 3/22/20228:44 AM

## 2020-06-03 ENCOUNTER — Other Ambulatory Visit: Payer: Self-pay

## 2020-06-03 ENCOUNTER — Inpatient Hospital Stay: Payer: BC Managed Care – PPO

## 2020-06-03 NOTE — Patient Instructions (Signed)
Therapeutic Phlebotomy Therapeutic phlebotomy is the planned removal of blood from a person's body for the purpose of treating a medical condition. The procedure is similar to donating blood. Usually, about a pint (470 mL, or 0.47 L) of blood is removed. The average adult has 9-12 pints (4.3-5.7 L) of blood in the body. Therapeutic phlebotomy may be used to treat the following medical conditions:  Hemochromatosis. This is a condition in which the blood contains too much iron.  Polycythemia vera. This is a condition in which the blood contains too many red blood cells.  Porphyria cutanea tarda. This is a disease in which an important part of hemoglobin is not made properly. It results in the buildup of abnormal amounts of porphyrins in the body.  Sickle cell disease. This is a condition in which the red blood cells form an abnormal crescent shape rather than a round shape. Tell a health care provider about:  Any allergies you have.  All medicines you are taking, including vitamins, herbs, eye drops, creams, and over-the-counter medicines.  Any problems you or family members have had with anesthetic medicines.  Any blood disorders you have.  Any surgeries you have had.  Any medical conditions you have.  Whether you are pregnant or may be pregnant. What are the risks? Generally, this is a safe procedure. However, problems may occur, including:  Nausea or light-headedness.  Low blood pressure (hypotension).  Soreness, bleeding, swelling, or bruising at the needle insertion site.  Infection. What happens before the procedure?  Follow instructions from your health care provider about eating or drinking restrictions.  Ask your health care provider about: ? Changing or stopping your regular medicines. This is especially important if you are taking diabetes medicines or blood thinners (anticoagulants). ? Taking medicines such as aspirin and ibuprofen. These medicines can thin your  blood. Do not take these medicines unless your health care provider tells you to take them. ? Taking over-the-counter medicines, vitamins, herbs, and supplements.  Wear clothing with sleeves that can be raised above the elbow.  Plan to have someone take you home from the hospital or clinic.  You may have a blood sample taken.  Your blood pressure, pulse rate, and breathing rate will be measured. What happens during the procedure?  To lower your risk of infection: ? Your health care team will wash or sanitize their hands. ? Your skin will be cleaned with an antiseptic.  You may be given a medicine to numb the area (local anesthetic).  A tourniquet will be placed on your arm.  A needle will be inserted into one of your veins.  Tubing and a collection bag will be attached to that needle.  Blood will flow through the needle and tubing into the collection bag.  The collection bag will be placed lower than your arm to allow gravity to help the flow of blood into the bag.  You may be asked to open and close your hand slowly and continually during the entire collection.  After the specified amount of blood has been removed from your body, the collection bag and tubing will be clamped.  The needle will be removed from your vein.  Pressure will be held on the site of the needle insertion to stop the bleeding.  A bandage (dressing) will be placed over the needle insertion site. The procedure may vary among health care providers and hospitals.   What happens after the procedure?  Your blood pressure, pulse rate, and breathing rate will   be measured after the procedure.  You will be encouraged to drink fluids.  Your recovery will be assessed and monitored.  You can return to your normal activities as told by your health care provider. Summary  Therapeutic phlebotomy is the planned removal of blood from a person's body for the purpose of treating a medical condition.  Therapeutic  phlebotomy may be used to treat hemochromatosis, polycythemia vera, porphyria cutanea tarda, or sickle cell disease.  In the procedure, a needle is inserted and about a pint (470 mL, or 0.47 L) of blood is removed. The average adult has 9-12 pints (4.3-5.7 L) of blood in the body.  This is generally a safe procedure, but it can sometimes cause problems such as nausea, light-headedness, or low blood pressure (hypotension). This information is not intended to replace advice given to you by your health care provider. Make sure you discuss any questions you have with your health care provider. Document Revised: 03/11/2017 Document Reviewed: 03/11/2017 Elsevier Patient Education  2021 Elsevier Inc.  

## 2020-06-03 NOTE — Progress Notes (Signed)
Mathews Robinsons presents today for phlebotomy per MD orders. Phlebotomy procedure started at 1520 and ended at 1530 504 grams removed. Patient observed for 30 minutes after procedure without any incident. Patient tolerated procedure well. IV needle removed intact.

## 2020-08-28 ENCOUNTER — Telehealth: Payer: Self-pay | Admitting: *Deleted

## 2020-08-28 ENCOUNTER — Encounter: Payer: Self-pay | Admitting: *Deleted

## 2020-08-28 ENCOUNTER — Inpatient Hospital Stay (HOSPITAL_BASED_OUTPATIENT_CLINIC_OR_DEPARTMENT_OTHER): Payer: BC Managed Care – PPO | Admitting: Family

## 2020-08-28 ENCOUNTER — Inpatient Hospital Stay: Payer: BC Managed Care – PPO | Attending: Family

## 2020-08-28 ENCOUNTER — Other Ambulatory Visit: Payer: Self-pay

## 2020-08-28 ENCOUNTER — Encounter: Payer: Self-pay | Admitting: Family

## 2020-08-28 DIAGNOSIS — Z86711 Personal history of pulmonary embolism: Secondary | ICD-10-CM | POA: Diagnosis not present

## 2020-08-28 LAB — CBC WITH DIFFERENTIAL (CANCER CENTER ONLY)
Abs Immature Granulocytes: 0.08 10*3/uL — ABNORMAL HIGH (ref 0.00–0.07)
Basophils Absolute: 0 10*3/uL (ref 0.0–0.1)
Basophils Relative: 0 %
Eosinophils Absolute: 0.1 10*3/uL (ref 0.0–0.5)
Eosinophils Relative: 1 %
HCT: 37.5 % (ref 36.0–46.0)
Hemoglobin: 12.5 g/dL (ref 12.0–15.0)
Immature Granulocytes: 1 %
Lymphocytes Relative: 35 %
Lymphs Abs: 3.4 10*3/uL (ref 0.7–4.0)
MCH: 32.8 pg (ref 26.0–34.0)
MCHC: 33.3 g/dL (ref 30.0–36.0)
MCV: 98.4 fL (ref 80.0–100.0)
Monocytes Absolute: 0.6 10*3/uL (ref 0.1–1.0)
Monocytes Relative: 6 %
Neutro Abs: 5.4 10*3/uL (ref 1.7–7.7)
Neutrophils Relative %: 57 %
Platelet Count: 276 10*3/uL (ref 150–400)
RBC: 3.81 MIL/uL — ABNORMAL LOW (ref 3.87–5.11)
RDW: 12.4 % (ref 11.5–15.5)
WBC Count: 9.6 10*3/uL (ref 4.0–10.5)
nRBC: 0 % (ref 0.0–0.2)

## 2020-08-28 LAB — CMP (CANCER CENTER ONLY)
ALT: 18 U/L (ref 0–44)
AST: 17 U/L (ref 15–41)
Albumin: 4.3 g/dL (ref 3.5–5.0)
Alkaline Phosphatase: 35 U/L — ABNORMAL LOW (ref 38–126)
Anion gap: 7 (ref 5–15)
BUN: 13 mg/dL (ref 6–20)
CO2: 31 mmol/L (ref 22–32)
Calcium: 9.4 mg/dL (ref 8.9–10.3)
Chloride: 103 mmol/L (ref 98–111)
Creatinine: 0.72 mg/dL (ref 0.44–1.00)
GFR, Estimated: >60 mL/min (ref >60)
Glucose, Bld: 92 mg/dL (ref 70–99)
Potassium: 4.3 mmol/L (ref 3.5–5.1)
Sodium: 141 mmol/L (ref 135–145)
Total Bilirubin: 0.3 mg/dL (ref 0.3–1.2)
Total Protein: 6.8 g/dL (ref 6.5–8.1)

## 2020-08-28 LAB — IRON AND TIBC
Iron: 71 ug/dL (ref 41–142)
Saturation Ratios: 27 % (ref 21–57)
TIBC: 260 ug/dL (ref 236–444)
UIBC: 189 ug/dL (ref 120–384)

## 2020-08-28 LAB — FERRITIN: Ferritin: 43 ng/mL (ref 11–307)

## 2020-08-28 NOTE — Progress Notes (Signed)
Hematology and Oncology Follow Up Visit  AJANI SCHNIEDERS [id number] 10/03/1970 50 y.o. 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 Hou is here today for follow-up. She is doing well but nots persistent fatigue. She is busy with work and life and does not sleep well.  She has not noted any blood loss. No bruising or petechiae.  She has not had a cycle since having a uterine ablation some years ago. She feels that she is now in menopause.  No fever, chills, n/v, cough, rash, dizziness, SOB, chest pain, palpitations, abdominal pain or changes in bowel or bladder habits.  No swelling, tenderness, numbness or tingling in her extremities at this time.  She has occasional positional tingling in her arms and legs.  No falls or syncope to report.  She has maintained a good appetite and is staying well hydrated. Her weight is stable at 195 lbs.   ECOG Performance Status: 1 - Symptomatic but completely ambulatory  Medications:  Allergies as of 08/28/2020   No Known Allergies      Medication List    as of August 28, 2020  8:54 AM   You have not been prescribed any medications.     Allergies: No Known Allergies  Past Medical History, Surgical history, Social history, and Family History were reviewed and updated.  Review of Systems: All other 10 point review of systems is negative.   Physical Exam:  height is 5\' 4"  (1.626 m) and weight is 195 lb (88.5 kg). Her oral temperature is 98.9 F (37.2 C). Her blood pressure is 108/71 and her pulse is 74. Her respiration is 18 and oxygen saturation is 100%.   Wt Readings from Last 3 Encounters:  08/28/20 195 lb (88.5 kg)  05/28/20 190 lb (86.2 kg)  02/28/20 186 lb 1.9 oz (84.4 kg)    Ocular: Sclerae unicteric, pupils  equal, round and reactive to light Ear-nose-throat: Oropharynx clear, dentition fair Lymphatic: No cervical or supraclavicular adenopathy Lungs no rales or rhonchi, good excursion bilaterally Heart regular rate and rhythm, no murmur appreciated Abd soft, nontender, positive bowel sounds MSK no focal spinal tenderness, no joint edema Neuro: non-focal, well-oriented, appropriate affect Breasts: Deferred   Lab Results  Component Value Date   WBC 9.6 08/28/2020   HGB 12.5 08/28/2020   HCT 37.5 08/28/2020   MCV 98.4 08/28/2020   PLT 276 08/28/2020   Lab Results  Component Value Date   FERRITIN 99 05/28/2020   IRON 137 05/28/2020   TIBC 255 05/28/2020   UIBC 118 (L) 05/28/2020   IRONPCTSAT 54 05/28/2020   Lab Results  Component Value Date   RBC 3.81 (L) 08/28/2020   No results found for: KPAFRELGTCHN, LAMBDASER, KAPLAMBRATIO No results found for: IGGSERUM, IGA, IGMSERUM No results found for: 08/30/2020, SPEI   Chemistry      Component Value Date/Time   NA 141 08/28/2020 0810   K 4.3 08/28/2020 0810   CL 103 08/28/2020 0810   CO2 31 08/28/2020 0810   BUN 13 08/28/2020 0810   CREATININE 0.72 08/28/2020 0810      Component Value Date/Time   CALCIUM 9.4 08/28/2020 0810   ALKPHOS 35 (L) 08/28/2020 0810   AST  17 08/28/2020 0810   ALT 18 08/28/2020 0810   BILITOT 0.3 08/28/2020 0810       Impression and Plan: Ms. 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  She also has history of PE in 2012 and had low protein c total and low protein s total and activity. So far, there has been no evidence of recurrence. Iron studies are pending. We will set her up for phlebotomy if needed.  Follow-up in 3 months.  She can contact our office with any questions or concerns.   Emeline Gins, NP 6/22/20228:54 AM

## 2020-08-28 NOTE — Telephone Encounter (Signed)
Per 08/28/20 los  - called patient and gave upcoming appointments - patient confirmed 

## 2020-11-17 ENCOUNTER — Other Ambulatory Visit: Payer: Self-pay

## 2020-11-17 ENCOUNTER — Ambulatory Visit
Admission: RE | Admit: 2020-11-17 | Discharge: 2020-11-17 | Disposition: A | Discharge status: Home / Self Care | Payer: BC Managed Care – PPO | Source: Ambulatory Visit | Attending: Obstetrics and Gynecology | Admitting: Obstetrics and Gynecology

## 2020-11-17 DIAGNOSIS — Z9189 Other specified personal risk factors, not elsewhere classified: Secondary | ICD-10-CM

## 2020-11-17 MED ORDER — GADOBUTROL 1 MMOL/ML IV SOLN
10.0000 mL | Freq: Once | INTRAVENOUS | Status: AC | PRN
  Administered 2020-11-17: 10 mL via INTRAVENOUS

## 2020-12-02 ENCOUNTER — Inpatient Hospital Stay (HOSPITAL_BASED_OUTPATIENT_CLINIC_OR_DEPARTMENT_OTHER): Payer: BC Managed Care – PPO | Admitting: Family

## 2020-12-02 ENCOUNTER — Other Ambulatory Visit: Payer: Self-pay

## 2020-12-02 ENCOUNTER — Encounter: Payer: Self-pay | Admitting: Family

## 2020-12-02 ENCOUNTER — Inpatient Hospital Stay: Payer: BC Managed Care – PPO | Attending: Family

## 2020-12-02 DIAGNOSIS — Z86711 Personal history of pulmonary embolism: Secondary | ICD-10-CM | POA: Diagnosis not present

## 2020-12-02 DIAGNOSIS — D6859 Other primary thrombophilia: Secondary | ICD-10-CM | POA: Diagnosis not present

## 2020-12-02 LAB — IRON AND TIBC
Iron: 97 ug/dL (ref 41–142)
Saturation Ratios: 38 % (ref 21–57)
TIBC: 255 ug/dL (ref 236–444)
UIBC: 158 ug/dL (ref 120–384)

## 2020-12-02 LAB — CBC WITH DIFFERENTIAL (CANCER CENTER ONLY)
Abs Immature Granulocytes: 0.04 10*3/uL (ref 0.00–0.07)
Basophils Absolute: 0.1 10*3/uL (ref 0.0–0.1)
Basophils Relative: 1 %
Eosinophils Absolute: 0.5 10*3/uL (ref 0.0–0.5)
Eosinophils Relative: 8 %
HCT: 37.9 % (ref 36.0–46.0)
Hemoglobin: 12.9 g/dL (ref 12.0–15.0)
Immature Granulocytes: 1 %
Lymphocytes Relative: 26 %
Lymphs Abs: 1.7 10*3/uL (ref 0.7–4.0)
MCH: 33.2 pg (ref 26.0–34.0)
MCHC: 34 g/dL (ref 30.0–36.0)
MCV: 97.7 fL (ref 80.0–100.0)
Monocytes Absolute: 0.4 10*3/uL (ref 0.1–1.0)
Monocytes Relative: 6 %
Neutro Abs: 3.8 10*3/uL (ref 1.7–7.7)
Neutrophils Relative %: 58 %
Platelet Count: 249 10*3/uL (ref 150–400)
RBC: 3.88 MIL/uL (ref 3.87–5.11)
RDW: 12.5 % (ref 11.5–15.5)
WBC Count: 6.5 10*3/uL (ref 4.0–10.5)
nRBC: 0 % (ref 0.0–0.2)

## 2020-12-02 LAB — CMP (CANCER CENTER ONLY)
ALT: 11 U/L (ref 0–44)
AST: 14 U/L — ABNORMAL LOW (ref 15–41)
Albumin: 4.3 g/dL (ref 3.5–5.0)
Alkaline Phosphatase: 36 U/L — ABNORMAL LOW (ref 38–126)
Anion gap: 7 (ref 5–15)
BUN: 8 mg/dL (ref 6–20)
CO2: 27 mmol/L (ref 22–32)
Calcium: 8.8 mg/dL — ABNORMAL LOW (ref 8.9–10.3)
Chloride: 104 mmol/L (ref 98–111)
Creatinine: 0.74 mg/dL (ref 0.44–1.00)
GFR, Estimated: >60 mL/min (ref >60)
Glucose, Bld: 103 mg/dL — ABNORMAL HIGH (ref 70–99)
Potassium: 4.3 mmol/L (ref 3.5–5.1)
Sodium: 138 mmol/L (ref 135–145)
Total Bilirubin: 0.4 mg/dL (ref 0.3–1.2)
Total Protein: 6.9 g/dL (ref 6.5–8.1)

## 2020-12-02 LAB — FERRITIN: Ferritin: 83 ng/mL (ref 11–307)

## 2020-12-02 NOTE — Progress Notes (Signed)
Hematology and Oncology Follow Up Visit  Tara Tucker [id number] [birth year] 50 y.o. 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 She is doing well but notes blood in her stool recently. She states that she has history of hemorrhoids but has not seen GI. She plans to call and make a new patient appointment with [facility].   No other blood loss noted. No bruising or petechiae.  She has hot flashes and night sweats and states that she is menopausal at this time.  No fever, chills, n/v, cough, rash, dizziness, SOB, chest pain, palpitations, abdominal pain or changes in bowel or bladder habits.  No swelling, tenderness, numbness or tingling in her extremities at this time.  No falls or syncope.  She has maintained a good appetite and is staying well hydrated. Her weight is stable at 198 lbs.   ECOG Performance Status: 1 - Symptomatic but completely ambulatory  Medications:  Allergies as of 12/02/2020   No Known Allergies      Medication List    as of December 02, 2020  8:31 AM   You have not been prescribed any medications.     Allergies: No Known Allergies  Past Medical History, Surgical history, Social history, and Family History were reviewed and updated.  Review of Systems: All other 10 point review of systems is negative.   Physical Exam:  vitals were not taken for this visit.   Wt Readings from Last 3 Encounters:  08/28/20 195 lb (88.5 kg)  05/28/20 190 lb (86.2 kg)  02/28/20 186 lb 1.9 oz (84.4 kg)    Ocular: Sclerae unicteric, pupils equal, round and reactive to light Ear-nose-throat: Oropharynx clear, dentition fair Lymphatic: No cervical or supraclavicular adenopathy Lungs no rales or rhonchi, good  excursion bilaterally Heart regular rate and rhythm, no murmur appreciated Abd soft, nontender, positive bowel sounds MSK no focal spinal tenderness, no joint edema Neuro: non-focal, well-oriented, appropriate affect Breasts: Deferred   Lab Results  Component Value Date   WBC 6.5 12/02/2020   HGB 12.9 12/02/2020   HCT 37.9 12/02/2020   MCV 97.7 12/02/2020   PLT 249 12/02/2020   Lab Results  Component Value Date   FERRITIN 43 08/28/2020   IRON 71 08/28/2020   TIBC 260 08/28/2020   UIBC 189 08/28/2020   IRONPCTSAT 27 08/28/2020   Lab Results  Component Value Date   RBC 3.88 12/02/2020   No results found for: KPAFRELGTCHN, LAMBDASER, KAPLAMBRATIO No results found for: IGGSERUM, IGA, IGMSERUM No results found for: Marda Stalker, SPEI   Chemistry      Component Value Date/Time   NA 141 08/28/2020 0810   K 4.3 08/28/2020 0810   CL 103 08/28/2020 0810   CO2 31 08/28/2020 0810   BUN 13 08/28/2020 0810   CREATININE 0.72 08/28/2020 0810      Component Value Date/Time   CALCIUM 9.4 08/28/2020 0810   ALKPHOS 35 (L) 08/28/2020 0810   AST 17 08/28/2020 0810   ALT 18 08/28/2020 0810   BILITOT 0.3 08/28/2020 0810       Impression and Plan: Tara Tucker is a very pleasant 50 yo caucasian female with recent diagnosis of  hereditary hemochromatosis, heterozygous for the C282Y mutation.  She also has history of PE in 2012 and had low protein c total and low protein s total and activity. So far, there has been no evidence of recurrence. Iron studies pending. We will schedule her for phlebotomy if needed.  Lab only in 3 months, follow-up in 6 months.  She can contact our office with any questions or concerns.   Eileen Stanford, NP 9/26/20228:31 AM

## 2021-03-04 ENCOUNTER — Other Ambulatory Visit: Payer: BC Managed Care – PPO

## 2021-03-05 ENCOUNTER — Other Ambulatory Visit: Payer: Self-pay

## 2021-03-05 ENCOUNTER — Inpatient Hospital Stay: Payer: BC Managed Care – PPO | Attending: Hematology & Oncology

## 2021-03-05 ENCOUNTER — Telehealth: Payer: Self-pay

## 2021-03-05 LAB — CBC WITH DIFFERENTIAL (CANCER CENTER ONLY)
Abs Immature Granulocytes: 0.01 10*3/uL (ref 0.00–0.07)
Basophils Absolute: 0 10*3/uL (ref 0.0–0.1)
Basophils Relative: 1 %
Eosinophils Absolute: 0.3 10*3/uL (ref 0.0–0.5)
Eosinophils Relative: 5 %
HCT: 36.9 % (ref 36.0–46.0)
Hemoglobin: 12.4 g/dL (ref 12.0–15.0)
Immature Granulocytes: 0 %
Lymphocytes Relative: 30 %
Lymphs Abs: 1.5 10*3/uL (ref 0.7–4.0)
MCH: 33 pg (ref 26.0–34.0)
MCHC: 33.6 g/dL (ref 30.0–36.0)
MCV: 98.1 fL (ref 80.0–100.0)
Monocytes Absolute: 0.4 10*3/uL (ref 0.1–1.0)
Monocytes Relative: 7 %
Neutro Abs: 2.9 10*3/uL (ref 1.7–7.7)
Neutrophils Relative %: 57 %
Platelet Count: 231 10*3/uL (ref 150–400)
RBC: 3.76 MIL/uL — ABNORMAL LOW (ref 3.87–5.11)
RDW: 12 % (ref 11.5–15.5)
WBC Count: 5 10*3/uL (ref 4.0–10.5)
nRBC: 0 % (ref 0.0–0.2)

## 2021-03-05 LAB — CMP (CANCER CENTER ONLY)
ALT: 14 U/L (ref 0–44)
AST: 14 U/L — ABNORMAL LOW (ref 15–41)
Albumin: 4.2 g/dL (ref 3.5–5.0)
Alkaline Phosphatase: 36 U/L — ABNORMAL LOW (ref 38–126)
Anion gap: 6 (ref 5–15)
BUN: 12 mg/dL (ref 6–20)
CO2: 28 mmol/L (ref 22–32)
Calcium: 9.2 mg/dL (ref 8.9–10.3)
Chloride: 106 mmol/L (ref 98–111)
Creatinine: 0.72 mg/dL (ref 0.44–1.00)
GFR, Estimated: >60 mL/min (ref >60)
Glucose, Bld: 103 mg/dL — ABNORMAL HIGH (ref 70–99)
Potassium: 4.4 mmol/L (ref 3.5–5.1)
Sodium: 140 mmol/L (ref 135–145)
Total Bilirubin: 0.5 mg/dL (ref 0.3–1.2)
Total Protein: 6.4 g/dL — ABNORMAL LOW (ref 6.5–8.1)

## 2021-03-05 LAB — IRON AND IRON BINDING CAPACITY (CC-WL,HP ONLY)
Iron: 113 ug/dL (ref 28–170)
Saturation Ratios: 51 % — ABNORMAL HIGH (ref 10.4–31.8)
TIBC: 223 ug/dL — ABNORMAL LOW (ref 250–450)
UIBC: 110 ug/dL — ABNORMAL LOW (ref 148–442)

## 2021-03-05 LAB — FERRITIN: Ferritin: 75 ng/mL (ref 11–307)

## 2021-03-05 NOTE — Telephone Encounter (Signed)
-----   Message from [name], MD sent at 03/05/2021 12:33 PM EST ----- Call - the iron saturation is a little too high!!  Needs 1 phlebotomy!!  Tara Tucker

## 2021-03-05 NOTE — Telephone Encounter (Signed)
Called and informed of lab results and that scheduling will reach out too schedule phlebotomy.

## 2021-03-06 ENCOUNTER — Inpatient Hospital Stay: Payer: BC Managed Care – PPO

## 2021-03-06 NOTE — Progress Notes (Signed)
Tara Tucker presents today for phlebotomy per MD orders. °Phlebotomy procedure started at 1415 and ended at 1430. °502 grams removed via phlebotomy kit. Refreshments given. Patient tolerated well °Patient observed for 30 minutes after procedure without any incident. °Patient tolerated procedure well. °IV needle removed intact. ° ° °

## 2021-03-06 NOTE — Patient Instructions (Signed)

## 2021-03-06 NOTE — Progress Notes (Signed)
Tara Tucker presents today for phlebotomy per MD orders. Phlebotomy procedure started at 1410 and ended at 1425. 507 grams removed from rt [facility] using 16g phlebotomy kit.  Patient observed for 30 minutes after procedure without any incident. Patient tolerated procedure well. IV needle removed intact.

## 2021-04-21 ENCOUNTER — Encounter: Payer: Self-pay | Admitting: Family

## 2021-06-02 ENCOUNTER — Other Ambulatory Visit: Payer: Self-pay

## 2021-06-02 ENCOUNTER — Encounter: Payer: Self-pay | Admitting: Family

## 2021-06-02 ENCOUNTER — Inpatient Hospital Stay: Payer: BC Managed Care – PPO | Attending: Hematology & Oncology | Admitting: Family

## 2021-06-02 ENCOUNTER — Inpatient Hospital Stay: Payer: BC Managed Care – PPO

## 2021-06-02 ENCOUNTER — Encounter: Payer: Self-pay | Admitting: *Deleted

## 2021-06-02 ENCOUNTER — Telehealth: Payer: Self-pay | Admitting: *Deleted

## 2021-06-02 DIAGNOSIS — Z86711 Personal history of pulmonary embolism: Secondary | ICD-10-CM | POA: Diagnosis not present

## 2021-06-02 LAB — CMP (CANCER CENTER ONLY)
ALT: 15 U/L (ref 0–44)
AST: 17 U/L (ref 15–41)
Albumin: 4.5 g/dL (ref 3.5–5.0)
Alkaline Phosphatase: 38 U/L (ref 38–126)
Anion gap: 5 (ref 5–15)
BUN: 10 mg/dL (ref 6–20)
CO2: 29 mmol/L (ref 22–32)
Calcium: 9.4 mg/dL (ref 8.9–10.3)
Chloride: 105 mmol/L (ref 98–111)
Creatinine: 0.71 mg/dL (ref 0.44–1.00)
GFR, Estimated: >60 mL/min (ref >60)
Glucose, Bld: 99 mg/dL (ref 70–99)
Potassium: 4 mmol/L (ref 3.5–5.1)
Sodium: 139 mmol/L (ref 135–145)
Total Bilirubin: 0.4 mg/dL (ref 0.3–1.2)
Total Protein: 7 g/dL (ref 6.5–8.1)

## 2021-06-02 LAB — CBC WITH DIFFERENTIAL (CANCER CENTER ONLY)
Abs Immature Granulocytes: 0.02 10*3/uL (ref 0.00–0.07)
Basophils Absolute: 0 10*3/uL (ref 0.0–0.1)
Basophils Relative: 1 %
Eosinophils Absolute: 0.4 10*3/uL (ref 0.0–0.5)
Eosinophils Relative: 8 %
HCT: 38.6 % (ref 36.0–46.0)
Hemoglobin: 12.7 g/dL (ref 12.0–15.0)
Immature Granulocytes: 0 %
Lymphocytes Relative: 25 %
Lymphs Abs: 1.4 10*3/uL (ref 0.7–4.0)
MCH: 32.2 pg (ref 26.0–34.0)
MCHC: 32.9 g/dL (ref 30.0–36.0)
MCV: 98 fL (ref 80.0–100.0)
Monocytes Absolute: 0.4 10*3/uL (ref 0.1–1.0)
Monocytes Relative: 7 %
Neutro Abs: 3.4 10*3/uL (ref 1.7–7.7)
Neutrophils Relative %: 59 %
Platelet Count: 264 10*3/uL (ref 150–400)
RBC: 3.94 MIL/uL (ref 3.87–5.11)
RDW: 12.8 % (ref 11.5–15.5)
WBC Count: 5.7 10*3/uL (ref 4.0–10.5)
nRBC: 0 % (ref 0.0–0.2)

## 2021-06-02 LAB — IRON AND IRON BINDING CAPACITY (CC-WL,HP ONLY)
Iron: 91 ug/dL (ref 28–170)
Saturation Ratios: 32 % — ABNORMAL HIGH (ref 10.4–31.8)
TIBC: 286 ug/dL (ref 250–450)
UIBC: 195 ug/dL (ref 148–442)

## 2021-06-02 LAB — FERRITIN: Ferritin: 58 ng/mL (ref 11–307)

## 2021-06-02 NOTE — Telephone Encounter (Signed)
Per 06/02/21 los - gave upcoming appointments - confirmed ?

## 2021-06-02 NOTE — Progress Notes (Signed)
?Hematology and Oncology Follow Up Visit ? ?Tara Tucker ?[id number] ?08/23/1970 51 y.o. ?06/02/2021 ? ? ?Principle Diagnosis:  ?Hereditary hemochromatosis, heterozygous for the C282Y mutation ?History of PE in 2012 ?Protein C & S deficiencies  ?  ?Current Therapy:        ?Phlebotomy to maintain iron saturation < 50% and ferritin < 100 ?  ?Interim History:  Tara Tucker is here today for follow-up. She is doing well and has no complaints at this time.  ?No blood loss noted. No more bruising, no petechiae.  ?No fever, chills, n/v, cough, rash, dizziness, SOB, chest pain, palpitations, abdominal pain or changes in bowel or bladder habits.  ?No swelling or tenderness in her extremities at this time.  ?She has tingling in her hands off and on. This is unchanged from baseline.  ?No falls or syncope to report.  ?She has maintained a good appetite and is staying well hydrated. Her weight is stable at 182 lbs.  ? ?ECOG Performance Status: 1 - Symptomatic but completely ambulatory ? ?Medications:  ?Allergies as of 06/02/2021   ?No Known Allergies ?  ? ?  ?Medication List  ?  ?as of June 02, 2021  9:12 AM ?  ?You have not been prescribed any medications. ?  ? ? ?Allergies: No Known Allergies ? ?Past Medical History, Surgical history, Social history, and Family History were reviewed and updated. ? ?Review of Systems: ?All other 10 point review of systems is negative.  ? ?Physical Exam: ? height is 5\' 4"  (1.626 m) and weight is 182 lb (82.6 kg). Her oral temperature is 98.2 ?F (36.8 ?C). Her blood pressure is 96/71 and her pulse is 71. Her respiration is 17 and oxygen saturation is 100%.  ? ?Wt Readings from Last 3 Encounters:  ?06/02/21 182 lb (82.6 kg)  ?12/02/20 198 lb (89.8 kg)  ?08/28/20 195 lb (88.5 kg)  ? ? ?Ocular: Sclerae unicteric, pupils equal, round and reactive to light ?Ear-nose-throat: Oropharynx clear, dentition fair ?Lymphatic: No cervical or supraclavicular adenopathy ?Lungs no rales or rhonchi, good excursion  bilaterally ?Heart regular rate and rhythm, no murmur appreciated ?Abd soft, nontender, positive bowel sounds ?MSK no focal spinal tenderness, no joint edema ?Neuro: non-focal, well-oriented, appropriate affect ?Breasts: Deferred  ? ?Lab Results  ?Component Value Date  ? WBC 5.7 06/02/2021  ? HGB 12.7 06/02/2021  ? HCT 38.6 06/02/2021  ? MCV 98.0 06/02/2021  ? PLT 264 06/02/2021  ? ?Lab Results  ?Component Value Date  ? FERRITIN 75 03/05/2021  ? IRON 113 03/05/2021  ? TIBC 223 (L) 03/05/2021  ? UIBC 110 (L) 03/05/2021  ? IRONPCTSAT 51 (H) 03/05/2021  ? ?Lab Results  ?Component Value Date  ? RBC 3.94 06/02/2021  ? ?No results found for: KPAFRELGTCHN, LAMBDASER, KAPLAMBRATIO ?No results found for: IGGSERUM, IGA, IGMSERUM ?No results found for: TOTALPROTELP, ALBUMINELP, A1GS, A2GS, BETS, BETA2SER, GAMS, MSPIKE, SPEI ?  Chemistry   ?   ?Component Value Date/Time  ? NA 140 03/05/2021 0834  ? K 4.4 03/05/2021 0834  ? CL 106 03/05/2021 0834  ? CO2 28 03/05/2021 0834  ? BUN 12 03/05/2021 0834  ? CREATININE 0.72 03/05/2021 0834  ?    ?Component Value Date/Time  ? CALCIUM 9.2 03/05/2021 0834  ? ALKPHOS 36 (L) 03/05/2021 0834  ? AST 14 (L) 03/05/2021 0834  ? ALT 14 03/05/2021 0834  ? BILITOT 0.5 03/05/2021 0834  ?  ? ? ? ?Impression and Plan: Tara Tucker is a very pleasant 51 yo  caucasian female with recent diagnosis of hereditary hemochromatosis, heterozygous for the C282Y mutation.  ?She also has history of PE in 2012 and had low protein c total and low protein s total and activity. No recurrence so far.  ?Iron studies pending. We will get her set up for phlebotomy if needed.  ?Lab check in 3 months, follow-up in 6 months.  ? ?Eileen Stanford, NP ?3/27/20239:12 AM ? ?

## 2021-06-03 ENCOUNTER — Telehealth: Payer: Self-pay | Admitting: *Deleted

## 2021-06-03 ENCOUNTER — Telehealth: Payer: Self-pay | Admitting: Hematology & Oncology

## 2021-06-03 NOTE — Telephone Encounter (Signed)
Per scheduling message [place] and lvm for call back to schedule 1 phlebotomy per Dr. Marin Olp. ?

## 2021-06-03 NOTE — Telephone Encounter (Signed)
Called to schedule one phlebotomy per order of Dr Marin Olp , left voicemail ?

## 2021-06-04 ENCOUNTER — Telehealth: Payer: Self-pay | Admitting: Family

## 2021-06-04 NOTE — Telephone Encounter (Signed)
Called to schedule phlebotomy per 3/27 sch msg, 2nd attempt left voicemail  ?

## 2021-06-11 ENCOUNTER — Inpatient Hospital Stay: Payer: BC Managed Care – PPO | Attending: Hematology & Oncology

## 2021-06-11 NOTE — Patient Instructions (Signed)
Therapeutic Phlebotomy °Therapeutic phlebotomy is the planned removal of blood from a person's body for the purpose of treating a medical condition. The procedure is lot like donating blood. Usually, about a pint (470 mL, or 0.47 L) of blood is removed. The average adult has 9-12 pints (4.3-5.7 L) of blood in his or her body. °Therapeutic phlebotomy may be used to treat the following medical conditions: °Hemochromatosis. This is a condition in which the blood contains too much iron. °Polycythemia vera. This is a condition in which the blood contains too many red blood cells. °Porphyria cutanea tarda. This is a disease in which an important part of hemoglobin is not made properly. It results in the buildup of abnormal amounts of porphyrins in the body. °Sickle cell disease. This is a condition in which the red blood cells form an abnormal crescent shape rather than a round shape. °Tell a health care provider about: °Any allergies you have. °All medicines you are taking, including vitamins, herbs, eye drops, creams, and over-the-counter medicines. °Any bleeding problems you have. °Any surgeries you have had. °Any medical conditions you have. °Whether you are pregnant or may be pregnant. °What are the risks? °Generally, this is a safe procedure. However, problems may occur, including: °Nausea or light-headedness. °Low blood pressure (hypotension). °Soreness, bleeding, swelling, or bruising at the needle insertion site. °Infection. °What happens before the procedure? °Ask your health care provider about: °Changing or stopping your regular medicines. This is especially important if you are taking diabetes medicines or blood thinners. °Taking medicines such as aspirin and ibuprofen. These medicines can thin your blood. Do not take these medicines unless your health care provider tells you to take them. °Taking over-the-counter medicines, vitamins, herbs, and supplements. °Wear clothing with sleeves that can be raised  above the elbow. °You may have a blood sample taken. °Your blood pressure, pulse rate, and breathing rate will be measured. °What happens during the procedure? ° °You may be given a medicine to numb the area (local anesthetic). °A tourniquet will be placed on your arm. °A needle will be put into one of your veins. °Tubing and a collection bag will be attached to the needle. °Blood will flow through the needle and tubing into the collection bag. °The collection bag will be placed lower than your arm so gravity can help the blood flow into the bag. °You may be asked to open and close your hand slowly and continually during the entire collection. °After the specified amount of blood has been removed from your body, the collection bag and tubing will be clamped. °The needle will be removed from your vein. °Pressure will be held on the needle site to stop the bleeding. °A bandage (dressing) will be placed over the needle insertion site. °The procedure may vary among health care providers and hospitals. °What happens after the procedure? °Your blood pressure, pulse rate, and breathing rate will be measured after the procedure. °You will be encouraged to drink fluids. °You will be encouraged to eat a snack to prevent a low blood sugar level. °Your recovery will be assessed and monitored. °Return to your normal activities as told by your health care provider. °Summary °Therapeutic phlebotomy is the planned removal of blood from a person's body for the purpose of treating a medical condition. °Therapeutic phlebotomy may be used to treat hemochromatosis, polycythemia vera, porphyria cutanea tarda, or sickle cell disease. °In the procedure, a needle is inserted and about a pint (470 mL, or 0.47 L) of blood is   removed. The average adult has 9-12 pints (4.3-5.7 L) of blood in the body. °This is generally a safe procedure, but it can sometimes cause problems such as nausea, light-headedness, or low blood pressure  (hypotension). °This information is not intended to replace advice given to you by your health care provider. Make sure you discuss any questions you have with your health care provider. °Document Revised: 08/21/2020 Document Reviewed: 08/21/2020 °Elsevier Patient Education © 2022 [employer] ° °

## 2021-06-11 NOTE — Progress Notes (Signed)
Mathews Robinsons presents today for phlebotomy per MD orders. ?Phlebotomy procedure started at 1520 and ended at 1528. ?250 grams removed. ?Patient observed for 30 minutes after procedure without any incident. ?Patient tolerated procedure well. ?IV needle removed intact. ? ? ?

## 2021-06-11 NOTE — Progress Notes (Signed)
Parameters met for Ferritin < 100 and Saturation ratio <50. Per Dr. Marin Olp, do one phlebotomy. BP is low today, 106/60. Per Lottie Dawson, NP, do half a phlebotomy. Patient verbalized understanding. ?

## 2021-09-02 ENCOUNTER — Inpatient Hospital Stay: Payer: BC Managed Care – PPO | Attending: Hematology & Oncology

## 2021-09-02 DIAGNOSIS — D6851 Activated protein C resistance: Secondary | ICD-10-CM | POA: Diagnosis not present

## 2021-09-02 LAB — CBC WITH DIFFERENTIAL (CANCER CENTER ONLY)
Abs Immature Granulocytes: 0.01 10*3/uL (ref 0.00–0.07)
Basophils Absolute: 0.1 10*3/uL (ref 0.0–0.1)
Basophils Relative: 1 %
Eosinophils Absolute: 0.3 10*3/uL (ref 0.0–0.5)
Eosinophils Relative: 5 %
HCT: 38.6 % (ref 36.0–46.0)
Hemoglobin: 13 g/dL (ref 12.0–15.0)
Immature Granulocytes: 0 %
Lymphocytes Relative: 28 %
Lymphs Abs: 1.5 10*3/uL (ref 0.7–4.0)
MCH: 33.1 pg (ref 26.0–34.0)
MCHC: 33.7 g/dL (ref 30.0–36.0)
MCV: 98.2 fL (ref 80.0–100.0)
Monocytes Absolute: 0.4 10*3/uL (ref 0.1–1.0)
Monocytes Relative: 7 %
Neutro Abs: 3.1 10*3/uL (ref 1.7–7.7)
Neutrophils Relative %: 59 %
Platelet Count: 253 10*3/uL (ref 150–400)
RBC: 3.93 MIL/uL (ref 3.87–5.11)
RDW: 12 % (ref 11.5–15.5)
WBC Count: 5.2 10*3/uL (ref 4.0–10.5)
nRBC: 0 % (ref 0.0–0.2)

## 2021-09-02 LAB — CMP (CANCER CENTER ONLY)
ALT: 11 U/L (ref 0–44)
AST: 14 U/L — ABNORMAL LOW (ref 15–41)
Albumin: 4.6 g/dL (ref 3.5–5.0)
Alkaline Phosphatase: 34 U/L — ABNORMAL LOW (ref 38–126)
Anion gap: 6 (ref 5–15)
BUN: 12 mg/dL (ref 6–20)
CO2: 28 mmol/L (ref 22–32)
Calcium: 9.6 mg/dL (ref 8.9–10.3)
Chloride: 106 mmol/L (ref 98–111)
Creatinine: 0.72 mg/dL (ref 0.44–1.00)
GFR, Estimated: >60 mL/min (ref >60)
Glucose, Bld: 106 mg/dL — ABNORMAL HIGH (ref 70–99)
Potassium: 4.6 mmol/L (ref 3.5–5.1)
Sodium: 140 mmol/L (ref 135–145)
Total Bilirubin: 0.6 mg/dL (ref 0.3–1.2)
Total Protein: 7.2 g/dL (ref 6.5–8.1)

## 2021-09-02 LAB — FERRITIN: Ferritin: 58 ng/mL (ref 11–307)

## 2021-09-02 LAB — IRON AND IRON BINDING CAPACITY (CC-WL,HP ONLY)
Iron: 103 ug/dL (ref 28–170)
Saturation Ratios: 38 % — ABNORMAL HIGH (ref 10.4–31.8)
TIBC: 273 ug/dL (ref 250–450)
UIBC: 170 ug/dL (ref 148–442)

## 2021-09-05 ENCOUNTER — Encounter: Payer: Self-pay | Admitting: Family

## 2021-12-03 ENCOUNTER — Inpatient Hospital Stay: Payer: BC Managed Care – PPO | Admitting: Family

## 2021-12-03 ENCOUNTER — Inpatient Hospital Stay: Payer: BC Managed Care – PPO

## 2021-12-31 ENCOUNTER — Inpatient Hospital Stay: Payer: BC Managed Care – PPO

## 2021-12-31 ENCOUNTER — Inpatient Hospital Stay: Payer: BC Managed Care – PPO | Attending: Family | Admitting: Family

## 2021-12-31 ENCOUNTER — Encounter: Payer: Self-pay | Admitting: Family

## 2021-12-31 DIAGNOSIS — Z86711 Personal history of pulmonary embolism: Secondary | ICD-10-CM | POA: Insufficient documentation

## 2021-12-31 LAB — CBC WITH DIFFERENTIAL (CANCER CENTER ONLY)
Abs Immature Granulocytes: 0.02 10*3/uL (ref 0.00–0.07)
Basophils Absolute: 0 10*3/uL (ref 0.0–0.1)
Basophils Relative: 1 %
Eosinophils Absolute: 0.3 10*3/uL (ref 0.0–0.5)
Eosinophils Relative: 5 %
HCT: 39.6 % (ref 36.0–46.0)
Hemoglobin: 12.9 g/dL (ref 12.0–15.0)
Immature Granulocytes: 0 %
Lymphocytes Relative: 30 %
Lymphs Abs: 1.5 10*3/uL (ref 0.7–4.0)
MCH: 33 pg (ref 26.0–34.0)
MCHC: 32.6 g/dL (ref 30.0–36.0)
MCV: 101.3 fL — ABNORMAL HIGH (ref 80.0–100.0)
Monocytes Absolute: 0.3 10*3/uL (ref 0.1–1.0)
Monocytes Relative: 5 %
Neutro Abs: 2.9 10*3/uL (ref 1.7–7.7)
Neutrophils Relative %: 59 %
Platelet Count: 280 10*3/uL (ref 150–400)
RBC: 3.91 MIL/uL (ref 3.87–5.11)
RDW: 12.6 % (ref 11.5–15.5)
WBC Count: 5.1 10*3/uL (ref 4.0–10.5)
nRBC: 0 % (ref 0.0–0.2)

## 2021-12-31 LAB — CMP (CANCER CENTER ONLY)
ALT: 14 U/L (ref 0–44)
AST: 15 U/L (ref 15–41)
Albumin: 4.6 g/dL (ref 3.5–5.0)
Alkaline Phosphatase: 39 U/L (ref 38–126)
Anion gap: 8 (ref 5–15)
BUN: 17 mg/dL (ref 6–20)
CO2: 30 mmol/L (ref 22–32)
Calcium: 9.4 mg/dL (ref 8.9–10.3)
Chloride: 106 mmol/L (ref 98–111)
Creatinine: 0.71 mg/dL (ref 0.44–1.00)
GFR, Estimated: >60 mL/min (ref >60)
Glucose, Bld: 110 mg/dL — ABNORMAL HIGH (ref 70–99)
Potassium: 4 mmol/L (ref 3.5–5.1)
Sodium: 144 mmol/L (ref 135–145)
Total Bilirubin: 0.4 mg/dL (ref 0.3–1.2)
Total Protein: 7 g/dL (ref 6.5–8.1)

## 2021-12-31 LAB — FERRITIN: Ferritin: 40 ng/mL (ref 11–307)

## 2021-12-31 NOTE — Progress Notes (Signed)
Hematology and Oncology Follow Up Visit  ARYELLE TISCHLER [id number] 1970/03/17 51 y.o. 12/31/2021   Principle Diagnosis:  Hereditary hemochromatosis, heterozygous for the C282Y mutation History of PE in 2012 Protein C & S deficiencies    Current Therapy:        Phlebotomy to maintain iron saturation < 50% and ferritin < 100   Interim History: Ms. Tara Tucker is here today for follow-up. She is doing well and has no complaints at this time.  She has occasional brief palpitations but states that this is rare.  No fever, chills, n/v, cough, rash, dizziness, SOB, chest pain, palpitations, abdominal pain or changes in bowel or bladder habits at this time.  She takes a stool softener as needed for constipation.  No swelling, tenderness, numbness or tingling in her extremities.  No syncope. She has had a couple instances where she tripped and fell. Thankfully she states that she was not seriously injured.  Appetite and hydration are good. Weight is 181 lbs.   ECOG Performance Status: 0 - Asymptomatic  Medications:  Allergies as of 12/31/2021   No Known Allergies      Medication List        Accurate as of December 31, 2021  3:15 PM. If you have any questions, ask your nurse or doctor.          ESTROVEN COMPLETE PO Take by mouth daily at 6 (six) AM.        Allergies: No Known Allergies  Past Medical History, Surgical history, Social history, and Family History were reviewed and updated.  Review of Systems: All other 10 point review of systems is negative.   Physical Exam:  weight is 181 lb 1.9 oz (82.2 kg). Her oral temperature is 98.7 F (37.1 C). Her blood pressure is 101/70 and her pulse is 81. Her respiration is 17 and oxygen saturation is 100%.   Wt Readings from Last 3 Encounters:  12/31/21 181 lb 1.9 oz (82.2 kg)  06/02/21 182 lb (82.6 kg)  12/02/20 198 lb (89.8 kg)    Ocular: Sclerae unicteric, pupils equal, round and reactive to light Ear-nose-throat:  Oropharynx clear, dentition fair Lymphatic: No cervical or supraclavicular adenopathy Lungs no rales or rhonchi, good excursion bilaterally Heart regular rate and rhythm, no murmur appreciated Abd soft, nontender, positive bowel sounds MSK no focal spinal tenderness, no joint edema Neuro: non-focal, well-oriented, appropriate affect Breasts: Deferred   Lab Results  Component Value Date   WBC 5.1 12/31/2021   HGB 12.9 12/31/2021   HCT 39.6 12/31/2021   MCV 101.3 (H) 12/31/2021   PLT 280 12/31/2021   Lab Results  Component Value Date   FERRITIN 58 09/02/2021   IRON 103 09/02/2021   TIBC 273 09/02/2021   UIBC 170 09/02/2021   IRONPCTSAT 38 (H) 09/02/2021   Lab Results  Component Value Date   RBC 3.91 12/31/2021   No results found for: "KPAFRELGTCHN", "LAMBDASER", "KAPLAMBRATIO" No results found for: "IGGSERUM", "IGA", "IGMSERUM" No results found for: "TOTALPROTELP", "ALBUMINELP", "A1GS", "A2GS", "BETS", "BETA2SER", "GAMS", "MSPIKE", "SPEI"   Chemistry      Component Value Date/Time   NA 144 12/31/2021 1426   K 4.0 12/31/2021 1426   CL 106 12/31/2021 1426   CO2 30 12/31/2021 1426   BUN 17 12/31/2021 1426   CREATININE 0.71 12/31/2021 1426      Component Value Date/Time   CALCIUM 9.4 12/31/2021 1426   ALKPHOS 39 12/31/2021 1426   AST 15 12/31/2021 1426   ALT 14  12/31/2021 1426   BILITOT 0.4 12/31/2021 1426       Impression and Plan: Ms. Morning is a very pleasant 51 yo caucasian female with recent diagnosis of hereditary hemochromatosis, heterozygous for the C282Y mutation.  She also has history of PE in 2012 and had low protein c total and low protein s total and activity. No recurrence so far.  Iron studies pending. She will donate with [facility] if needed.  Lab check in 3 months, follow-up in 6 months.   Lottie Dawson, NP 10/25/20233:15 PM

## 2022-01-01 LAB — IRON AND IRON BINDING CAPACITY (CC-WL,HP ONLY)
Iron: 106 ug/dL (ref 28–170)
Saturation Ratios: 36 % — ABNORMAL HIGH (ref 10.4–31.8)
TIBC: 297 ug/dL (ref 250–450)
UIBC: 191 ug/dL (ref 148–442)

## 2022-01-08 ENCOUNTER — Telehealth: Payer: Self-pay | Admitting: *Deleted

## 2022-01-08 NOTE — Telephone Encounter (Signed)
Per 12/31/21 los - called patient and lvm of upcoming appointments - requested callback to confirm.

## 2022-04-03 ENCOUNTER — Inpatient Hospital Stay: Payer: BC Managed Care – PPO | Attending: Hematology & Oncology

## 2022-04-03 LAB — CBC WITH DIFFERENTIAL (CANCER CENTER ONLY)
Abs Immature Granulocytes: 0.01 10*3/uL (ref 0.00–0.07)
Basophils Absolute: 0 10*3/uL (ref 0.0–0.1)
Basophils Relative: 1 %
Eosinophils Absolute: 0.2 10*3/uL (ref 0.0–0.5)
Eosinophils Relative: 4 %
HCT: 39.2 % (ref 36.0–46.0)
Hemoglobin: 12.8 g/dL (ref 12.0–15.0)
Immature Granulocytes: 0 %
Lymphocytes Relative: 31 %
Lymphs Abs: 1.7 10*3/uL (ref 0.7–4.0)
MCH: 32.7 pg (ref 26.0–34.0)
MCHC: 32.7 g/dL (ref 30.0–36.0)
MCV: 100.3 fL — ABNORMAL HIGH (ref 80.0–100.0)
Monocytes Absolute: 0.4 10*3/uL (ref 0.1–1.0)
Monocytes Relative: 7 %
Neutro Abs: 3.1 10*3/uL (ref 1.7–7.7)
Neutrophils Relative %: 57 %
Platelet Count: 274 10*3/uL (ref 150–400)
RBC: 3.91 MIL/uL (ref 3.87–5.11)
RDW: 11.9 % (ref 11.5–15.5)
WBC Count: 5.5 10*3/uL (ref 4.0–10.5)
nRBC: 0 % (ref 0.0–0.2)

## 2022-04-03 LAB — CMP (CANCER CENTER ONLY)
ALT: 13 U/L (ref 0–44)
AST: 13 U/L — ABNORMAL LOW (ref 15–41)
Albumin: 4.7 g/dL (ref 3.5–5.0)
Alkaline Phosphatase: 41 U/L (ref 38–126)
Anion gap: 10 (ref 5–15)
BUN: 17 mg/dL (ref 6–20)
CO2: 29 mmol/L (ref 22–32)
Calcium: 9.4 mg/dL (ref 8.9–10.3)
Chloride: 106 mmol/L (ref 98–111)
Creatinine: 0.68 mg/dL (ref 0.44–1.00)
GFR, Estimated: >60 mL/min (ref >60)
Glucose, Bld: 96 mg/dL (ref 70–99)
Potassium: 4 mmol/L (ref 3.5–5.1)
Sodium: 145 mmol/L (ref 135–145)
Total Bilirubin: 0.4 mg/dL (ref 0.3–1.2)
Total Protein: 7.2 g/dL (ref 6.5–8.1)

## 2022-04-03 LAB — IRON AND IRON BINDING CAPACITY (CC-WL,HP ONLY)
Iron: 105 ug/dL (ref 28–170)
Saturation Ratios: 34 % — ABNORMAL HIGH (ref 10.4–31.8)
TIBC: 307 ug/dL (ref 250–450)
UIBC: 202 ug/dL (ref 148–442)

## 2022-04-03 LAB — FERRITIN: Ferritin: 45 ng/mL (ref 11–307)

## 2022-07-03 ENCOUNTER — Inpatient Hospital Stay: Payer: BC Managed Care – PPO | Attending: Hematology & Oncology

## 2022-07-03 ENCOUNTER — Encounter: Payer: Self-pay | Admitting: Family

## 2022-07-03 ENCOUNTER — Inpatient Hospital Stay (HOSPITAL_BASED_OUTPATIENT_CLINIC_OR_DEPARTMENT_OTHER): Payer: BC Managed Care – PPO | Admitting: Family

## 2022-07-03 ENCOUNTER — Other Ambulatory Visit: Payer: Self-pay

## 2022-07-03 DIAGNOSIS — Z86711 Personal history of pulmonary embolism: Secondary | ICD-10-CM | POA: Insufficient documentation

## 2022-07-03 LAB — CMP (CANCER CENTER ONLY)
ALT: 12 U/L (ref 0–44)
AST: 15 U/L (ref 15–41)
Albumin: 4.5 g/dL (ref 3.5–5.0)
Alkaline Phosphatase: 45 U/L (ref 38–126)
Anion gap: 6 (ref 5–15)
BUN: 14 mg/dL (ref 6–20)
CO2: 29 mmol/L (ref 22–32)
Calcium: 9.3 mg/dL (ref 8.9–10.3)
Chloride: 109 mmol/L (ref 98–111)
Creatinine: 0.71 mg/dL (ref 0.44–1.00)
GFR, Estimated: >60 mL/min (ref >60)
Glucose, Bld: 97 mg/dL (ref 70–99)
Potassium: 4.4 mmol/L (ref 3.5–5.1)
Sodium: 144 mmol/L (ref 135–145)
Total Bilirubin: 0.4 mg/dL (ref 0.3–1.2)
Total Protein: 7.1 g/dL (ref 6.5–8.1)

## 2022-07-03 LAB — IRON AND IRON BINDING CAPACITY (CC-WL,HP ONLY)
Iron: 139 ug/dL (ref 28–170)
Saturation Ratios: 45 % — ABNORMAL HIGH (ref 10.4–31.8)
TIBC: 309 ug/dL (ref 250–450)
UIBC: 170 ug/dL (ref 148–442)

## 2022-07-03 LAB — CBC WITH DIFFERENTIAL (CANCER CENTER ONLY)
Abs Immature Granulocytes: 0.02 10*3/uL (ref 0.00–0.07)
Basophils Absolute: 0.1 10*3/uL (ref 0.0–0.1)
Basophils Relative: 1 %
Eosinophils Absolute: 0.4 10*3/uL (ref 0.0–0.5)
Eosinophils Relative: 7 %
HCT: 39.2 % (ref 36.0–46.0)
Hemoglobin: 13.2 g/dL (ref 12.0–15.0)
Immature Granulocytes: 0 %
Lymphocytes Relative: 30 %
Lymphs Abs: 1.8 10*3/uL (ref 0.7–4.0)
MCH: 33.2 pg (ref 26.0–34.0)
MCHC: 33.7 g/dL (ref 30.0–36.0)
MCV: 98.7 fL (ref 80.0–100.0)
Monocytes Absolute: 0.4 10*3/uL (ref 0.1–1.0)
Monocytes Relative: 6 %
Neutro Abs: 3.3 10*3/uL (ref 1.7–7.7)
Neutrophils Relative %: 56 %
Platelet Count: 263 10*3/uL (ref 150–400)
RBC: 3.97 MIL/uL (ref 3.87–5.11)
RDW: 12.2 % (ref 11.5–15.5)
WBC Count: 6 10*3/uL (ref 4.0–10.5)
nRBC: 0 % (ref 0.0–0.2)

## 2022-07-03 LAB — FERRITIN: Ferritin: 50 ng/mL (ref 11–307)

## 2022-07-03 NOTE — Progress Notes (Signed)
Hematology and Oncology Follow Up Visit  GOLDIA LIGMAN [id number] 07-12-1970 52 y.o. 07/03/2022   Principle Diagnosis:  Hereditary hemochromatosis, heterozygous for the C282Y mutation History of PE in 2012 Protein C & S deficiencies    Current Therapy:        Phlebotomy to maintain iron saturation < 50% and ferritin < 100   Interim History:  Ms. Dibbern is here today for follow-up. 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 She states that steroids have not helped and she is considering having an injection if needed.  No swelling in her extremities at this time.  No falls or syncope reported.  No blood loss, bruising or petechiae.  No fever, chills, n/v, cough, rash, dizziness, SOB, chest pain, palpitations, abdominal pain or changes in bowel or bladder habits.  Appetite and hydration are good.   ECOG Performance Status: 1 - Symptomatic but completely ambulatory  Medications:  Allergies as of 07/03/2022   No Known Allergies      Medication List        Accurate as of July 03, 2022  1:52 PM. If you have any questions, ask your nurse or doctor.          ESTROVEN COMPLETE PO Take by mouth daily at 6 (six) AM.        Allergies: No Known Allergies  Past Medical History, Surgical history, Social history, and Family History were reviewed and updated.  Review of Systems: All other 10 point review of systems is negative.   Physical Exam:  oral temperature is 97.7 F (36.5 C). Her blood pressure is 110/74 and her pulse is 68. Her respiration is 18 and oxygen saturation is 100%.   Wt Readings from Last 3 Encounters:  12/31/21 181 lb 1.9 oz (82.2 kg)  06/02/21 182 lb (82.6 kg)  12/02/20 198 lb (89.8 kg)    Ocular: Sclerae unicteric, pupils equal, round and reactive to light Ear-nose-throat: Oropharynx clear, dentition fair Lymphatic: No cervical or supraclavicular adenopathy Lungs no rales or rhonchi, good excursion  bilaterally Heart regular rate and rhythm, no murmur appreciated Abd soft, nontender, positive bowel sounds MSK no focal spinal tenderness, no joint edema Neuro: non-focal, well-oriented, appropriate affect Breasts: Deferred   Lab Results  Component Value Date   WBC 6.0 07/03/2022   HGB 13.2 07/03/2022   HCT 39.2 07/03/2022   MCV 98.7 07/03/2022   PLT 263 07/03/2022   Lab Results  Component Value Date   FERRITIN 45 04/03/2022   IRON 105 04/03/2022   TIBC 307 04/03/2022   UIBC 202 04/03/2022   IRONPCTSAT 34 (H) 04/03/2022   Lab Results  Component Value Date   RBC 3.97 07/03/2022   No results found for: "KPAFRELGTCHN", "LAMBDASER", "KAPLAMBRATIO" No results found for: "IGGSERUM", "IGA", "IGMSERUM" No results found for: "TOTALPROTELP", "ALBUMINELP", "A1GS", "A2GS", "BETS", "BETA2SER", "GAMS", "MSPIKE", "SPEI"   Chemistry      Component Value Date/Time   NA 145 04/03/2022 1328   K 4.0 04/03/2022 1328   CL 106 04/03/2022 1328   CO2 29 04/03/2022 1328   BUN 17 04/03/2022 1328   CREATININE 0.68 04/03/2022 1328      Component Value Date/Time   CALCIUM 9.4 04/03/2022 1328   ALKPHOS 41 04/03/2022 1328   AST 13 (L) 04/03/2022 1328   ALT 13 04/03/2022 1328   BILITOT 0.4 04/03/2022 1328       Impression and Plan: Ms. Grenz is a very pleasant  52 yo caucasian female with recent diagnosis of hereditary hemochromatosis, heterozygous for the C282Y mutation.  She also has history of PE in 2012 and had low protein c total and low protein s total and activity. No recurrence so far.  Iron studies pending. She will donate with [facility] if needed.  Lab check in 3 months, follow-up in 6 months.   Eileen Stanford, NP 4/26/20241:52 PM

## 2022-11-30 ENCOUNTER — Emergency Department (HOSPITAL_COMMUNITY): Payer: BC Managed Care – PPO

## 2022-11-30 ENCOUNTER — Emergency Department (HOSPITAL_COMMUNITY)
Admission: EM | Admit: 2022-11-30 | Discharge: 2022-11-30 | Disposition: A | Discharge status: Home / Self Care | Payer: BC Managed Care – PPO | Attending: Emergency Medicine | Admitting: Emergency Medicine

## 2022-11-30 ENCOUNTER — Encounter (HOSPITAL_COMMUNITY): Payer: Self-pay

## 2022-11-30 ENCOUNTER — Other Ambulatory Visit: Payer: Self-pay

## 2022-11-30 DIAGNOSIS — W540XXA Bitten by dog, initial encounter: Secondary | ICD-10-CM | POA: Insufficient documentation

## 2022-11-30 DIAGNOSIS — S91012A Laceration without foreign body, left ankle, initial encounter: Secondary | ICD-10-CM | POA: Diagnosis not present

## 2022-11-30 DIAGNOSIS — Z23 Encounter for immunization: Secondary | ICD-10-CM | POA: Insufficient documentation

## 2022-11-30 DIAGNOSIS — S99912A Unspecified injury of left ankle, initial encounter: Secondary | ICD-10-CM | POA: Diagnosis present

## 2022-11-30 MED ORDER — AMOXICILLIN-POT CLAVULANATE 875-125 MG PO TABS
1.0000 | ORAL_TABLET | Freq: Once | ORAL | Status: AC
  Administered 2022-11-30: 1 via ORAL
  Filled 2022-11-30: qty 1

## 2022-11-30 MED ORDER — AMOXICILLIN-POT CLAVULANATE 875-125 MG PO TABS: 1.0000 | ORAL_TABLET | Freq: Two times a day (BID) | ORAL | 0 refills | Status: DC

## 2022-11-30 MED ORDER — IBUPROFEN 400 MG PO TABS
600.0000 mg | ORAL_TABLET | Freq: Once | ORAL | Status: AC
  Administered 2022-11-30: 600 mg via ORAL
  Filled 2022-11-30: qty 1

## 2022-11-30 MED ORDER — LIDOCAINE-EPINEPHRINE (PF) 2 %-1:200000 IJ SOLN
20.0000 mL | Freq: Once | INTRAMUSCULAR | Status: AC
  Administered 2022-11-30: 20 mL
  Filled 2022-11-30: qty 20

## 2022-11-30 MED ORDER — TETANUS-DIPHTH-ACELL PERTUSSIS 5-2.5-18.5 LF-MCG/0.5 IM SUSY
0.5000 mL | PREFILLED_SYRINGE | Freq: Once | INTRAMUSCULAR | Status: AC
  Administered 2022-11-30: 0.5 mL via INTRAMUSCULAR
  Filled 2022-11-30: qty 0.5

## 2022-11-30 NOTE — ED Provider Notes (Signed)
[place] EMERGENCY DEPARTMENT AT [facility] Provider Note   CSN: [id number] Arrival date & time: 11/30/22  1859     History  Chief Complaint  Patient presents with   Animal Bite    Tara Tucker is a 52 y.o. female.  HPI     This is a 52 year old female who is brought in by EMS after being bit by dog.  She was bit by an unknown dog.  She stated was an unprovoked bite.  She was bitten on the left ankle.  She did fall and hit her left elbow.  Reported that the dog's owner thinks that the dog is up-to-date on his vaccinations.  After confirmation, it appears he had a primary vaccine series that expired in May.  Patient denies hitting her head or loss of consciousness.  Unknown last tetanus shot.  Home Medications Prior to Admission medications   Medication Sig Start Date End Date Taking? Authorizing Provider  amoxicillin-clavulanate (AUGMENTIN) 875-125 MG tablet Take 1 tablet by mouth every 12 (twelve) hours. 11/30/22  Yes Naser Schuld, Mayer Masker, MD      Allergies    Patient has no known allergies.    Review of Systems   Review of Systems  Skin:  Positive for wound.  All other systems reviewed and are negative.   Physical Exam Updated Vital Signs BP 106/70 (BP Location: Right Arm)   Pulse 75   Temp 98.2 F (36.8 C) (Oral)   Resp 18   Ht 1.626 m (5\' 4" )   Wt 89.8 kg   SpO2 99%   BMI 33.99 kg/m  Physical Exam Vitals and nursing note reviewed.  Constitutional:      Appearance: She is well-developed. She is not ill-appearing.  HENT:     Head: Normocephalic and atraumatic.  Eyes:     Pupils: Pupils are equal, round, and reactive to light.  Cardiovascular:     Rate and Rhythm: Normal rate and regular rhythm.  Pulmonary:     Effort: Pulmonary effort is normal. No respiratory distress.  Abdominal:     Palpations: Abdomen is soft.  Musculoskeletal:     Cervical back: Neck supple.     Comments: Limited range of motion with flexion extension of the left  elbow, normal supination and pronation, no obvious deformities  Skin:    General: Skin is warm and dry.     Comments: Avulsion laceration left medial and anterior ankle, slight oozing noted  Neurological:     Mental Status: She is alert and oriented to person, place, and time.  Psychiatric:        Mood and Affect: Mood normal.     ED Results / Procedures / Treatments   Labs (all labs ordered are listed, but only abnormal results are displayed) Labs Reviewed - No data to display  EKG None  Radiology DG Ankle Complete Left  Result Date: 11/30/2022 CLINICAL DATA:  Fall, left ankle pain EXAM: LEFT ANKLE COMPLETE - 3+ VIEW COMPARISON:  None Available. FINDINGS: There is no evidence of fracture, dislocation, or joint effusion. There is no evidence of arthropathy or other focal bone abnormality. Soft tissues are unremarkable. IMPRESSION: Negative. Electronically Signed   By: Helyn Numbers M.D.   On: 11/30/2022 22:06   DG Elbow Complete Left  Result Date: 11/30/2022 CLINICAL DATA:  Animal bite, fall EXAM: LEFT ELBOW - COMPLETE 3+ VIEW COMPARISON:  None Available. FINDINGS: There is no evidence of fracture, dislocation, or joint effusion. There is no evidence  of arthropathy or other focal bone abnormality. Soft tissues are unremarkable. IMPRESSION: Negative. Electronically Signed   By: Helyn Numbers M.D.   On: 11/30/2022 22:06    Procedures Procedures    Medications Ordered in ED Medications  Tdap (BOOSTRIX) injection 0.5 mL (0.5 mLs Intramuscular Given 11/30/22 2041)  lidocaine-EPINEPHrine (XYLOCAINE W/EPI) 2 %-1:200000 (PF) injection 20 mL (20 mLs Infiltration Given by Other 11/30/22 2221)  amoxicillin-clavulanate (AUGMENTIN) 875-125 MG per tablet 1 tablet (1 tablet Oral Given 11/30/22 2139)  ibuprofen (ADVIL) tablet 600 mg (600 mg Oral Given 11/30/22 2139)    ED Course/ Medical Decision Making/ A&P                                 Medical Decision Making Amount and/or Complexity  of Data Reviewed Radiology: ordered.  Risk Prescription drug management.   This patient presents to the ED for concern of dog bite, this involves an extensive number of treatment options, and is a complaint that carries with it a high risk of complications and morbidity.  I considered the following differential and admission for this acute, potentially life threatening condition.  The differential diagnosis includes dog bite, infection, fracture, tendon injury  MDM:    This is a 52 year old female who presents with a dog bite to the left ankle.  She is nontoxic.  This is an isolated bite.  Tetanus was updated.  Wound was copiously irrigated and sewn up.  See additional note for details.  X-rays do not show any evidence of foreign body or fracture.  We discussed risk and benefits of rabies vaccination.  Can confirm animals vaccination status through May 2024.  Patient is currently in animal control custody.  Patient elected to defer rabies vaccination at this time.  Discussed with her that I felt that this was a very low risk of bite.  (Labs, imaging, consults)  Labs: I Ordered, and personally interpreted labs.  The pertinent results include: None  Imaging Studies ordered: I ordered imaging studies including  left elbow, left ankle I independently visualized and interpreted imaging. I agree with the radiologist interpretation  Additional history obtained from review.  External records from outside source obtained and reviewed including prior evaluations  Cardiac Monitoring: The patient was not maintained on a cardiac monitor.  If on the cardiac monitor, I personally viewed and interpreted the cardiac monitored which showed an underlying rhythm of: N/A  Reevaluation: After the interventions noted above, I reevaluated the patient and found that they have :improved  Social Determinants of Health:  lives independently  Disposition: Discharge  Co morbidities that complicate the patient  evaluation  Past Medical History:  Diagnosis Date   History of blood clots    Pulmonary emboli (HCC)      Medicines Meds ordered this encounter  Medications   Tdap (BOOSTRIX) injection 0.5 mL   lidocaine-EPINEPHrine (XYLOCAINE W/EPI) 2 %-1:200000 (PF) injection 20 mL   amoxicillin-clavulanate (AUGMENTIN) 875-125 MG per tablet 1 tablet   ibuprofen (ADVIL) tablet 600 mg   amoxicillin-clavulanate (AUGMENTIN) 875-125 MG tablet    Sig: Take 1 tablet by mouth every 12 (twelve) hours.    Dispense:  14 tablet    Refill:  0    I have reviewed the patients home medicines and have made adjustments as needed  Problem List / ED Course: Problem List Items Addressed This Visit   None Visit Diagnoses     Dog bite, initial  encounter    -  Primary                   Final Clinical Impression(s) / ED Diagnoses Final diagnoses:  Dog bite, initial encounter    Rx / DC Orders ED Discharge Orders          Ordered    amoxicillin-clavulanate (AUGMENTIN) 875-125 MG tablet  Every 12 hours        11/30/22 2216              Shon Baton, MD 11/30/22 2227

## 2022-11-30 NOTE — ED Notes (Signed)
Pt was able to ambulate to the bathroom without any assistance

## 2022-11-30 NOTE — Discharge Instructions (Signed)
You were seen today for dog bite.  This was repaired.  Follow-up closely for suture removal in 10 to 14 days.  You may apply ice to your left elbow.  Take ibuprofen as needed for pain.  Monitor for signs and symptoms of infection including increasing redness or drainage from the dog bite.

## 2022-11-30 NOTE — ED Provider Notes (Signed)
..  Laceration Repair  Date/Time: 11/30/2022 10:23 PM  Performed by: Lyman Speller, MD Authorized by: Shon Baton, MD   Consent:    Consent obtained:  Verbal   Consent given by:  Patient   Risks discussed:  Infection, need for additional repair, pain and poor cosmetic result Universal protocol:    Site/side marked: yes     Immediately prior to procedure, a time out was called: yes     Patient identity confirmed:  Verbally with patient Anesthesia:    Anesthesia method:  Local infiltration   Local anesthetic:  Lidocaine 2% WITH epi Laceration details:    Location:  Leg   Leg location:  L lower leg   Length (cm):  8 Exploration:    Imaging obtained: x-ray   Treatment:    Area cleansed with:  Saline   Amount of cleaning:  Extensive   Irrigation solution:  Sterile saline   Irrigation method:  Pressure wash   Visualized foreign bodies/material removed: no     Debridement:  None   Undermining:  None Skin repair:    Repair method:  Sutures   Suture size:  4-0   Suture material:  Prolene   Suture technique:  Horizontal mattress and simple interrupted   Number of sutures:  11 Approximation:    Approximation:  Close Post-procedure details:    Procedure completion:  Tolerated well, no immediate complications         Lyman Speller, MD 11/30/22 2225    Shon Baton, MD 11/30/22 2227

## 2022-11-30 NOTE — ED Triage Notes (Addendum)
Patient brought in by [facility] where patient was walking. Unknown dog came up to patient and made patient fall when dog bit her. Dog did sink his teeth into left lower leg, deep lac to lower leg with evulsion. Patient states dog owner did say dog was vaccinated against rabies. Patient endorses left elbow pain from striking the ground. Patient did not hit head, lose consciousness, no thinners.   EMS vitals BP 140/82 O2 99 on room air HR 82 RR 20

## 2022-12-14 ENCOUNTER — Encounter: Payer: Self-pay | Admitting: Family

## 2022-12-29 ENCOUNTER — Ambulatory Visit: Payer: BC Managed Care – PPO | Admitting: Medical Oncology

## 2022-12-29 ENCOUNTER — Inpatient Hospital Stay: Payer: BC Managed Care – PPO

## 2022-12-31 ENCOUNTER — Other Ambulatory Visit: Payer: Self-pay | Admitting: Medical Oncology

## 2022-12-31 ENCOUNTER — Inpatient Hospital Stay: Payer: BC Managed Care – PPO | Admitting: Medical Oncology

## 2022-12-31 ENCOUNTER — Encounter: Payer: Self-pay | Admitting: Medical Oncology

## 2022-12-31 ENCOUNTER — Ambulatory Visit (HOSPITAL_BASED_OUTPATIENT_CLINIC_OR_DEPARTMENT_OTHER)
Admission: RE | Admit: 2022-12-31 | Discharge: 2022-12-31 | Disposition: A | Discharge status: Home / Self Care | Payer: BC Managed Care – PPO | Source: Ambulatory Visit | Attending: Medical Oncology | Admitting: Medical Oncology

## 2022-12-31 ENCOUNTER — Inpatient Hospital Stay: Payer: BC Managed Care – PPO | Attending: Hematology & Oncology

## 2022-12-31 ENCOUNTER — Encounter: Payer: Self-pay | Admitting: Family

## 2022-12-31 DIAGNOSIS — R0602 Shortness of breath: Secondary | ICD-10-CM | POA: Insufficient documentation

## 2022-12-31 DIAGNOSIS — Z86711 Personal history of pulmonary embolism: Secondary | ICD-10-CM | POA: Insufficient documentation

## 2022-12-31 DIAGNOSIS — I3139 Other pericardial effusion (noninflammatory): Secondary | ICD-10-CM | POA: Insufficient documentation

## 2022-12-31 DIAGNOSIS — R053 Chronic cough: Secondary | ICD-10-CM | POA: Insufficient documentation

## 2022-12-31 LAB — CMP (CANCER CENTER ONLY)
ALT: 12 U/L (ref 0–44)
AST: 13 U/L — ABNORMAL LOW (ref 15–41)
Albumin: 5 g/dL (ref 3.5–5.0)
Alkaline Phosphatase: 43 U/L (ref 38–126)
Anion gap: 8 (ref 5–15)
BUN: 12 mg/dL (ref 6–20)
CO2: 29 mmol/L (ref 22–32)
Calcium: 8.9 mg/dL (ref 8.9–10.3)
Chloride: 105 mmol/L (ref 98–111)
Creatinine: 0.67 mg/dL (ref 0.44–1.00)
GFR, Estimated: >60 mL/min (ref >60)
Glucose, Bld: 97 mg/dL (ref 70–99)
Potassium: 3.8 mmol/L (ref 3.5–5.1)
Sodium: 142 mmol/L (ref 135–145)
Total Bilirubin: 0.3 mg/dL (ref 0.3–1.2)
Total Protein: 7.2 g/dL (ref 6.5–8.1)

## 2022-12-31 LAB — CBC WITH DIFFERENTIAL (CANCER CENTER ONLY)
Abs Immature Granulocytes: 0.02 10*3/uL (ref 0.00–0.07)
Basophils Absolute: 0 10*3/uL (ref 0.0–0.1)
Basophils Relative: 1 %
Eosinophils Absolute: 0.3 10*3/uL (ref 0.0–0.5)
Eosinophils Relative: 5 %
HCT: 39.4 % (ref 36.0–46.0)
Hemoglobin: 13.2 g/dL (ref 12.0–15.0)
Immature Granulocytes: 0 %
Lymphocytes Relative: 34 %
Lymphs Abs: 1.9 10*3/uL (ref 0.7–4.0)
MCH: 32.3 pg (ref 26.0–34.0)
MCHC: 33.5 g/dL (ref 30.0–36.0)
MCV: 96.3 fL (ref 80.0–100.0)
Monocytes Absolute: 0.3 10*3/uL (ref 0.1–1.0)
Monocytes Relative: 6 %
Neutro Abs: 3 10*3/uL (ref 1.7–7.7)
Neutrophils Relative %: 54 %
Platelet Count: 264 10*3/uL (ref 150–400)
RBC: 4.09 MIL/uL (ref 3.87–5.11)
RDW: 12.3 % (ref 11.5–15.5)
WBC Count: 5.6 10*3/uL (ref 4.0–10.5)
nRBC: 0 % (ref 0.0–0.2)

## 2022-12-31 LAB — FERRITIN: Ferritin: 53 ng/mL (ref 11–307)

## 2022-12-31 LAB — D-DIMER, QUANTITATIVE: D-Dimer, Quant: 1.56 ug{FEU}/mL — ABNORMAL HIGH (ref 0.00–0.50)

## 2022-12-31 MED ORDER — IOHEXOL 350 MG/ML SOLN
75.0000 mL | Freq: Once | INTRAVENOUS | Status: AC | PRN
  Administered 2022-12-31: 75 mL via INTRAVENOUS

## 2022-12-31 NOTE — Progress Notes (Signed)
Hematology and Oncology Follow Up Visit  Tara Tucker [id number] August 27, 1970 52 y.o. 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   She has been ok. Has a chronic cough which she has had for a few months. Happened on its own. Mild SOB with exertion and at rest. Mild chest tightness without pain. Has history of unprovoked PE in 2012. Not currently on anticoagulation medication.   No GERD that she has noticed. Stress is high.  No recent travel, procedures. Has had a dog attack but this was after symptoms started. Not on any hormonal medication.  No swelling in her extremities at this time.  No falls or syncope reported.  No blood loss, bruising or petechiae.  No fever, chills, n/v, cough, rash, dizziness, palpitations, abdominal pain or changes in bowel or bladder habits.  Appetite and hydration are good.   ECOG Performance Status: 1 - Symptomatic but completely ambulatory  Medications:  Allergies as of 12/31/2022   No Known Allergies      Medication List        Accurate as of December 31, 2022  2:39 PM. If you have any questions, ask your nurse or doctor.          amoxicillin-clavulanate 875-125 MG tablet Commonly known as: AUGMENTIN Take 1 tablet by mouth every 12 (twelve) hours.        Allergies: No Known Allergies  Past Medical History, Surgical history, Social history, and Family History were reviewed and updated.  Review of Systems: All other 10 point review of systems is negative.   Physical Exam:  weight is 196 lb 1.3 oz (88.9 kg). Her oral temperature is 98.8 F (37.1 C). Her blood pressure is 111/64 and her pulse is 77. Her respiration is 17 and oxygen saturation is 100%.   Wt Readings from Last 3  Encounters:  12/31/22 196 lb 1.3 oz (88.9 kg)  11/30/22 198 lb (89.8 kg)  12/31/21 181 lb 1.9 oz (82.2 kg)    Ocular: Sclerae unicteric, pupils equal, round and reactive to light Ear-nose-throat: Oropharynx clear, dentition fair Lymphatic: No cervical or supraclavicular adenopathy Lungs no rales or rhonchi, good excursion bilaterally Heart regular rate and rhythm, no murmur appreciated Abd soft, nontender, positive bowel sounds MSK no focal spinal tenderness, no joint edema Neuro: non-focal, well-oriented, appropriate affect  Lab Results  Component Value Date   WBC 6.0 07/03/2022   HGB 13.2 07/03/2022   HCT 39.2 07/03/2022   MCV 98.7 07/03/2022   PLT 263 07/03/2022   Lab Results  Component Value Date   FERRITIN 50 07/03/2022   IRON 139 07/03/2022   TIBC 309 07/03/2022   UIBC 170 07/03/2022   IRONPCTSAT 45 (H) 07/03/2022   Lab Results  Component Value Date   RBC 3.97 07/03/2022   No results found for: "KPAFRELGTCHN", "LAMBDASER", "KAPLAMBRATIO" No results found for: "IGGSERUM", "IGA", "IGMSERUM" No results found for: "TOTALPROTELP", "ALBUMINELP", "A1GS", "A2GS", "BETS", "BETA2SER", "GAMS", "MSPIKE", "SPEI"   Chemistry      Component Value Date/Time   NA 144 07/03/2022 1319   K 4.4 07/03/2022 1319   CL 109 07/03/2022 1319   CO2 29 07/03/2022 1319   BUN 14 07/03/2022 1319   CREATININE 0.71  07/03/2022 1319      Component Value Date/Time   CALCIUM 9.3 07/03/2022 1319   ALKPHOS 45 07/03/2022 1319   AST 15 07/03/2022 1319   ALT 12 07/03/2022 1319   BILITOT 0.4 07/03/2022 1319      Encounter Diagnoses  Name Primary?   Hereditary hemochromatosis (HCC) Yes   History of pulmonary embolus (PE)    Chronic cough    SOB (shortness of breath)     Impression and Plan: Tara Tucker is a very pleasant 52 yo caucasian female with recent diagnosis of hereditary hemochromatosis, heterozygous for the C282Y mutation.   She also has history of PE in 2012 and had low protein c  total and low protein s total and activity. Concerns for potential recurrence. D-dimer pending as well as a chest x ray. Will obtain CTA if needed.  Iron studies pending. She will donate with [facility] if needed.   D-dimer Chest x ray RTC 3 months APP, labs (CBC, CMP, iron, ferritin)-[place]  Rushie Chestnut, PA-C 10/24/20242:39 PM

## 2023-01-01 ENCOUNTER — Other Ambulatory Visit: Payer: Self-pay

## 2023-01-01 ENCOUNTER — Ambulatory Visit (HOSPITAL_BASED_OUTPATIENT_CLINIC_OR_DEPARTMENT_OTHER)
Admission: RE | Admit: 2023-01-01 | Discharge: 2023-01-01 | Disposition: A | Discharge status: Home / Self Care | Payer: BC Managed Care – PPO | Source: Ambulatory Visit | Attending: Medical Oncology | Admitting: Medical Oncology

## 2023-01-01 ENCOUNTER — Other Ambulatory Visit: Payer: Self-pay | Admitting: Medical Oncology

## 2023-01-01 DIAGNOSIS — Z86718 Personal history of other venous thrombosis and embolism: Secondary | ICD-10-CM

## 2023-01-01 DIAGNOSIS — M79605 Pain in left leg: Secondary | ICD-10-CM

## 2023-01-01 DIAGNOSIS — R7989 Other specified abnormal findings of blood chemistry: Secondary | ICD-10-CM | POA: Diagnosis present

## 2023-01-01 DIAGNOSIS — M79604 Pain in right leg: Secondary | ICD-10-CM | POA: Diagnosis present

## 2023-01-01 LAB — IRON AND IRON BINDING CAPACITY (CC-WL,HP ONLY)
Iron: 89 ug/dL (ref 28–170)
Saturation Ratios: 28 % (ref 10.4–31.8)
TIBC: 315 ug/dL (ref 250–450)
UIBC: 226 ug/dL (ref 148–442)

## 2023-01-11 ENCOUNTER — Encounter: Payer: Self-pay | Admitting: Gastroenterology

## 2023-01-26 ENCOUNTER — Encounter: Payer: Self-pay | Admitting: Family

## 2023-02-03 ENCOUNTER — Ambulatory Visit (AMBULATORY_SURGERY_CENTER): Payer: BC Managed Care – PPO | Admitting: *Deleted

## 2023-02-03 VITALS — Ht 64.0 in | Wt 190.0 lb

## 2023-02-03 DIAGNOSIS — Z860100 Personal history of colon polyps, unspecified: Secondary | ICD-10-CM

## 2023-02-03 MED ORDER — NA SULFATE-K SULFATE-MG SULF 17.5-3.13-1.6 GM/177ML PO SOLN
1.0000 | Freq: Once | ORAL | 0 refills | Status: AC
Start: 2023-02-03 — End: 2023-02-03

## 2023-02-03 NOTE — Progress Notes (Signed)
Pt's name and DOB verified at the beginning of the pre-visit wit 2 identifiers  Pt denies any difficulty with ambulating,sitting, laying down or rolling side to side  Pt has issues with ambulation   Pt has no issues moving head neck or swallowing  No egg or soy allergy known to patient   No issues known to pt with past sedation with any surgeries or procedures  Pt denies having issues being intubated   No FH of Malignant Hyperthermia  Pt is not on diet pills or shots  Pt is not on home 02   Pt has frequent issues with constipation RN instructed pt to use Miralax per bottles instructions a week before prep days. Pt states they will  Pt is not on dialysis  Pt denise any abnormal heart rhythms   Pt denies any upcoming cardiac testing  Pt encouraged to use to use Singlecare or Goodrx to reduce cost   Patient's chart reviewed by Cathlyn Parsons CNRA prior to pre-visit and patient appropriate for the [facility].  Pre-visit completed and red dot placed by patient's name on their procedure day (on provider's schedule).  .  Visit by phone  Pt states weight is 190 lb  Instructed pt why it is important to and  to call if they have any changes in health or new medications. Directed them to the # given and on instructions.     Instructions reviewed. Pt given both [facility] main # and MD on call # prior to instructions.  Pt states understanding. Instructed to review again prior to procedure. Pt states they will.   Instructions sent by mail with coupon and by My Chart   Coupon sent via text to mobile phone and pt verified they received it

## 2023-02-15 ENCOUNTER — Encounter: Payer: Self-pay | Admitting: Gastroenterology

## 2023-02-16 ENCOUNTER — Other Ambulatory Visit: Payer: Self-pay

## 2023-02-16 DIAGNOSIS — Z860100 Personal history of colon polyps, unspecified: Secondary | ICD-10-CM

## 2023-02-16 MED ORDER — SUTAB 1479-225-188 MG PO TABS
12.0000 | ORAL_TABLET | ORAL | 0 refills | Status: DC
Start: 2023-02-16 — End: 2023-02-24

## 2023-02-24 ENCOUNTER — Encounter: Payer: Self-pay | Admitting: Gastroenterology

## 2023-02-24 ENCOUNTER — Ambulatory Visit: Payer: BC Managed Care – PPO | Admitting: Gastroenterology

## 2023-02-24 VITALS — BP 104/68 | HR 74 | Temp 98.3°F | Resp 11 | Ht 64.0 in | Wt 190.0 lb

## 2023-02-24 DIAGNOSIS — K644 Residual hemorrhoidal skin tags: Secondary | ICD-10-CM

## 2023-02-24 DIAGNOSIS — K640 First degree hemorrhoids: Secondary | ICD-10-CM

## 2023-02-24 DIAGNOSIS — Z1211 Encounter for screening for malignant neoplasm of colon: Secondary | ICD-10-CM

## 2023-02-24 DIAGNOSIS — Z860100 Personal history of colon polyps, unspecified: Secondary | ICD-10-CM

## 2023-02-24 MED ORDER — SODIUM CHLORIDE 0.9 % IV SOLN: 500.0000 mL | INTRAVENOUS | Status: AC

## 2023-02-24 NOTE — Patient Instructions (Addendum)
Recommendation:  - Patient has a contact number available for emergencies. The signs and symptoms of potential delayed complications were discussed with the patient. Return to normal activities tomorrow. Written discharge instructions were provided to the patient.  - Resume previous diet.  - Continue present medications.  - Repeat colonoscopy in 10 years for screening purposes.   YOU HAD AN ENDOSCOPIC PROCEDURE TODAY AT THE [facility]:   Refer to the procedure report that was given to you for any specific questions about what was found during the examination.  If the procedure report does not answer your questions, please call your gastroenterologist to clarify.  If you requested that your care partner not be given the details of your procedure findings, then the procedure report has been included in a sealed envelope for you to review at your convenience later.  YOU SHOULD EXPECT: Some feelings of bloating in the abdomen. Passage of more gas than usual.  Walking can help get rid of the air that was put into your GI tract during the procedure and reduce the bloating. If you had a lower endoscopy (such as a colonoscopy or flexible sigmoidoscopy) you may notice spotting of blood in your stool or on the toilet paper. If you underwent a bowel prep for your procedure, you may not have a normal bowel movement for a few days.  Please Note:  You might notice some irritation and congestion in your nose or some drainage.  This is from the oxygen used during your procedure.  There is no need for concern and it should clear up in a day or so.  SYMPTOMS TO REPORT IMMEDIATELY:  Following lower endoscopy (colonoscopy or flexible sigmoidoscopy):  Excessive amounts of blood in the stool  Significant tenderness or worsening of abdominal pains  Swelling of the abdomen that is new, acute  Fever of 100F or higher   For urgent or emergent issues, a gastroenterologist can be reached at any hour by  calling [phone]. Do not use MyChart messaging for urgent concerns.    DIET:  We do recommend a small meal at first, but then you may proceed to your regular diet.  Drink plenty of fluids but you should avoid alcoholic beverages for 24 hours.  MEDICATIONS: Continue present medications.  Handouts given to patient: Hemorrhoids.  FOLLOW UP: Repeat colonoscopy in 10 years for screening purposes.  Thank you for allowing [place] to provide for your healthcare needs today.  ACTIVITY:  You should plan to take it easy for the rest of today and you should NOT DRIVE or use heavy machinery until tomorrow (because of the sedation medicines used during the test).    FOLLOW UP: Our staff will call the number listed on your records the next business day following your procedure.  We will call around 7:15- 8:00 am to check on you and address any questions or concerns that you may have regarding the information given to you following your procedure. If we do not reach you, we will leave a message.     If any biopsies were taken you will be contacted by phone or by letter within the next 1-3 weeks.  Please call us at [phone] if you have not heard about the biopsies in 3 weeks.    SIGNATURES/CONFIDENTIALITY: You and/or your care partner have signed paperwork which will be entered into your electronic medical record.  These signatures attest to the fact that that the information above on your After Visit Summary has been  reviewed and is understood.  Full responsibility of the confidentiality of this discharge information lies with you and/or your care-partner.

## 2023-02-24 NOTE — Op Note (Signed)
[facility] Patient Name: Tara Tucker Procedure Date: 02/24/2023 8:26 AM MRN: [id number] Endoscopist: Lorin Picket E. Tomasa Rand , MD, [id number] Age: 52 Referring MD:  Date of Birth: [birth year] Gender: Female Account #: [id number] Procedure:                Colonoscopy Indications:              Screening for colorectal malignant neoplasm (last                            colonoscopy was more than 10 years ago) Medicines:                Monitored Anesthesia Care Procedure:                Pre-Anesthesia Assessment:                           - Prior to the procedure, a History and Physical                            was performed, and patient medications and                            allergies were reviewed. The patient's tolerance of                            previous anesthesia was also reviewed. The risks                            and benefits of the procedure and the sedation                            options and risks were discussed with the patient.                            All questions were answered, and informed consent                            was obtained. Prior Anticoagulants: The patient has                            taken no anticoagulant or antiplatelet agents. ASA                            Grade Assessment: II - A patient with mild systemic                            disease. After reviewing the risks and benefits,                            the patient was deemed in satisfactory condition to                            undergo the procedure.  After obtaining informed consent, the colonoscope                            was passed under direct vision. Throughout the                            procedure, the patient's blood pressure, pulse, and                            oxygen saturations were monitored continuously. The                            CF [id number] #[id number] was introduced through the anus                            and advanced  to the the terminal ileum, with                            identification of the appendiceal orifice and IC                            valve. The colonoscopy was performed without                            difficulty. The patient tolerated the procedure                            well. The quality of the bowel preparation was                            good. The terminal ileum, ileocecal valve,                            appendiceal orifice, and rectum were photographed.                            The bowel preparation used was SUTAB via split dose                            instruction. Scope In: 8:33:16 AM Scope Out: 8:47:57 AM Scope Withdrawal Time: 0 hours 9 minutes 45 seconds  Total Procedure Duration: 0 hours 14 minutes 41 seconds  Findings:                 Skin tags were found on perianal exam.                           The digital rectal exam was normal. Pertinent                            negatives include normal sphincter tone and no                            palpable rectal lesions.  The colon (entire examined portion) appeared normal.                           The terminal ileum appeared normal.                           Non-bleeding internal hemorrhoids were found during                            retroflexion. The hemorrhoids were Grade I                            (internal hemorrhoids that do not prolapse).                           No additional abnormalities were found on                            retroflexion. Complications:            No immediate complications. Estimated Blood Loss:     Estimated blood loss: none. Impression:               - Perianal skin tags found on perianal exam.                           - The entire examined colon is normal.                           - The examined portion of the ileum was normal.                           - Non-bleeding internal hemorrhoids.                           - No specimens  collected. Recommendation:           - Patient has a contact number available for                            emergencies. The signs and symptoms of potential                            delayed complications were discussed with the                            patient. Return to normal activities tomorrow.                            Written discharge instructions were provided to the                            patient.                           - Resume previous diet.                           -  Continue present medications.                           - Repeat colonoscopy in 10 years for screening                            purposes. Chucky Homes E. Tomasa Rand, MD 02/24/2023 8:53:13 AM This report has been signed electronically.

## 2023-02-24 NOTE — Progress Notes (Signed)
[facility] History and Physical   Primary Care Physician:  Marinda Elk, MD   Reason for Procedure:   Colon cancer screening  Plan:    Screening colonoscopy     HPI: Tara Tucker is a 52 y.o. female undergoing initial average risk screening colonoscopy.  She has no family history of colon cancer and no chronic GI symptoms.  She underwent a colonoscopy in her 73s and recalls having polyps, but believes that they were not precancerous.   Past Medical History:  Diagnosis Date   Arthritis    Bilateral hip   Constipation    History of blood clots    Pulmonary emboli [facility])     Past Surgical History:  Procedure Laterality Date   BUNIONECTOMY     CESAREAN SECTION     COLONOSCOPY     dog attack     2024 stitches on ankle    Prior to Admission medications   Medication Sig Start Date End Date Taking? Authorizing Provider  cetirizine (ZYRTEC) 10 MG tablet Take 10 mg by mouth daily.   Yes [provider]  OVER THE COUNTER MEDICATION Excedrin migraine  PRN   Yes [provider]  amoxicillin-clavulanate (AUGMENTIN) 875-125 MG tablet Take 1 tablet by mouth every 12 (twelve) hours. Patient not taking: Reported on 12/31/2022 11/30/22   Shon Baton, MD    Current Outpatient Medications  Medication Sig Dispense Refill   cetirizine (ZYRTEC) 10 MG tablet Take 10 mg by mouth daily.     OVER THE COUNTER MEDICATION Excedrin migraine  PRN     amoxicillin-clavulanate (AUGMENTIN) 875-125 MG tablet Take 1 tablet by mouth every 12 (twelve) hours. (Patient not taking: Reported on 12/31/2022) 14 tablet 0   Current Facility-Administered Medications  Medication Dose Route Frequency Provider Last Rate Last Admin   0.9 %  sodium chloride infusion  500 mL Intravenous Continuous Jenel Lucks, MD        Allergies as of 02/24/2023   (No Known Allergies)    Family History  Problem Relation Age of Onset   Colon polyps Neg Hx    Colon cancer Neg Hx     Esophageal cancer Neg Hx    Stomach cancer Neg Hx    Rectal cancer Neg Hx     Social History   Socioeconomic History   Marital status: Married    Spouse name: Not on file   Number of children: Not on file   Years of education: Not on file   Highest education level: Not on file  Occupational History   Not on file  Tobacco Use   Smoking status: Never   Smokeless tobacco: Never  Vaping Use   Vaping status: Never Used  Substance and Sexual Activity   Alcohol use: Yes    Comment: weekly   Drug use: No   Sexual activity: Not on file  Other Topics Concern   Not on file  Social History Narrative   Not on file   Social Drivers of Health   Financial Resource Strain: Low Risk  (01/05/2023)   Received from [facility]   Overall Financial Resource Strain (CARDIA)    Difficulty of Paying Living Expenses: Not very hard  Food Insecurity: No Food Insecurity (01/05/2023)   Received from [facility]   Hunger Vital Sign    Worried About Running Out of Food in the Last Year: Never true    Ran Out of Food in the Last Year: Never true  Transportation Needs:  No Transportation Needs (01/05/2023)   Received from [facility] - Transportation    Lack of Transportation (Medical): No    Lack of Transportation (Non-Medical): No  Physical Activity: Insufficiently Active (01/05/2023)   Received from [facility]   Exercise Vital Sign    Days of Exercise per Week: 1 day    Minutes of Exercise per Session: 60 min  Stress: No Stress Concern Present (01/05/2023)   Received from [facility] - Occupational Stress Questionnaire    Feeling of Stress : Only a little  Social Connections: Moderately Integrated (01/05/2023)   Received from [facility]   Social Network    How would you rate your social network (family, work, friends)?: Adequate participation with social networks  Intimate Partner Violence: Not At Risk (01/05/2023)    Received from [facility]   HITS    Over the last 12 months how often did your partner physically hurt you?: Never    Over the last 12 months how often did your partner insult you or talk down to you?: Never    Over the last 12 months how often did your partner threaten you with physical harm?: Never    Over the last 12 months how often did your partner scream or curse at you?: Never    Review of Systems:  All other review of systems negative except as mentioned in the HPI.  Physical Exam: Vital signs BP 111/63   Pulse (!) 110   Temp 98.3 F (36.8 C)   Ht 5\' 4"  (1.626 m)   Wt 190 lb (86.2 kg)   SpO2 95%   BMI 32.61 kg/m   General:   Alert,  Well-developed, well-nourished, pleasant and cooperative in NAD Airway:  Mallampati 2 Lungs:  Clear throughout to auscultation.   Heart:  Regular rate and rhythm; no murmurs, clicks, rubs,  or gallops. Abdomen:  Soft, nontender and nondistended. Normal bowel sounds.   Neuro/Psych:  Normal mood and affect. A and O x 3   Rashan Patient E. Tomasa Rand, MD [facility]

## 2023-02-24 NOTE — Progress Notes (Signed)
Sedate, gd SR, tolerated procedure well, VSS, report to RN 

## 2023-02-24 NOTE — Progress Notes (Signed)
Pt's states no medical or surgical changes since previsit or office visit. 

## 2023-02-25 ENCOUNTER — Telehealth: Payer: Self-pay

## 2023-02-25 NOTE — Telephone Encounter (Signed)
  Follow up Call-     02/24/2023    7:37 AM  Call back number  Post procedure Call Back phone  # [phone]  Permission to leave phone message Yes     Patient questions:  Do you have a fever, pain , or abdominal swelling? No. Pain Score  0 *  Have you tolerated food without any problems? Yes.    Have you been able to return to your normal activities? Yes.    Do you have any questions about your discharge instructions: Diet   No. Medications  No. Follow up visit  No.  Do you have questions or concerns about your Care? No.  Actions: * If pain score is 4 or above: No action needed, pain <4.

## 2023-03-30 ENCOUNTER — Inpatient Hospital Stay (HOSPITAL_BASED_OUTPATIENT_CLINIC_OR_DEPARTMENT_OTHER): Payer: BC Managed Care – PPO | Admitting: Medical Oncology

## 2023-03-30 ENCOUNTER — Encounter: Payer: Self-pay | Admitting: Medical Oncology

## 2023-03-30 ENCOUNTER — Inpatient Hospital Stay: Payer: BC Managed Care – PPO | Attending: Hematology & Oncology

## 2023-03-30 ENCOUNTER — Other Ambulatory Visit: Payer: Self-pay | Admitting: Medical Oncology

## 2023-03-30 DIAGNOSIS — R7989 Other specified abnormal findings of blood chemistry: Secondary | ICD-10-CM | POA: Insufficient documentation

## 2023-03-30 DIAGNOSIS — Z86718 Personal history of other venous thrombosis and embolism: Secondary | ICD-10-CM | POA: Insufficient documentation

## 2023-03-30 DIAGNOSIS — Z86711 Personal history of pulmonary embolism: Secondary | ICD-10-CM | POA: Diagnosis not present

## 2023-03-30 LAB — CMP (CANCER CENTER ONLY)
ALT: 14 U/L (ref 0–44)
AST: 17 U/L (ref 15–41)
Albumin: 4 g/dL (ref 3.5–5.0)
Alkaline Phosphatase: 42 U/L (ref 38–126)
Anion gap: 7 (ref 5–15)
BUN: 16 mg/dL (ref 6–20)
CO2: 25 mmol/L (ref 22–32)
Calcium: 8.7 mg/dL — ABNORMAL LOW (ref 8.9–10.3)
Chloride: 107 mmol/L (ref 98–111)
Creatinine: 0.7 mg/dL (ref 0.44–1.00)
GFR, Estimated: >60 mL/min (ref >60)
Glucose, Bld: 95 mg/dL (ref 70–99)
Potassium: 3.8 mmol/L (ref 3.5–5.1)
Sodium: 139 mmol/L (ref 135–145)
Total Bilirubin: 0.4 mg/dL (ref 0.0–1.2)
Total Protein: 7 g/dL (ref 6.5–8.1)

## 2023-03-30 LAB — CBC WITH DIFFERENTIAL (CANCER CENTER ONLY)
Abs Immature Granulocytes: 0.03 10*3/uL (ref 0.00–0.07)
Basophils Absolute: 0 10*3/uL (ref 0.0–0.1)
Basophils Relative: 1 %
Eosinophils Absolute: 0.2 10*3/uL (ref 0.0–0.5)
Eosinophils Relative: 3 %
HCT: 36.4 % (ref 36.0–46.0)
Hemoglobin: 12.2 g/dL (ref 12.0–15.0)
Immature Granulocytes: 1 %
Lymphocytes Relative: 27 %
Lymphs Abs: 1.6 10*3/uL (ref 0.7–4.0)
MCH: 32.6 pg (ref 26.0–34.0)
MCHC: 33.5 g/dL (ref 30.0–36.0)
MCV: 97.3 fL (ref 80.0–100.0)
Monocytes Absolute: 0.3 10*3/uL (ref 0.1–1.0)
Monocytes Relative: 6 %
Neutro Abs: 3.7 10*3/uL (ref 1.7–7.7)
Neutrophils Relative %: 62 %
Platelet Count: 277 10*3/uL (ref 150–400)
RBC: 3.74 MIL/uL — ABNORMAL LOW (ref 3.87–5.11)
RDW: 12.3 % (ref 11.5–15.5)
WBC Count: 5.8 10*3/uL (ref 4.0–10.5)
nRBC: 0 % (ref 0.0–0.2)

## 2023-03-30 LAB — FERRITIN: Ferritin: 39 ng/mL (ref 11–307)

## 2023-03-30 LAB — D-DIMER, QUANTITATIVE: D-Dimer, Quant: 1.12 ug{FEU}/mL — ABNORMAL HIGH (ref 0.00–0.50)

## 2023-03-30 NOTE — Progress Notes (Signed)
Hematology and Oncology Follow Up Visit  LINDELL SEMMEL [id number] 04-09-1970 53 y.o. 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 Tara Tucker is here today for follow-up.   No GERD that she has noticed but continues to have her cough. No hemoptysis, no chest pain.  Stress is very high.  No recent travel, procedures.  Not on any hormonal medication.  No swelling in her extremities at this time.  No falls or syncope reported.  No blood loss, bruising or petechiae.  No fever, chills, n/v, cough, rash, dizziness, palpitations, abdominal pain or changes in bowel or bladder habits.  Appetite and hydration are good.   ECOG Performance Status: 1 - Symptomatic but completely ambulatory  Medications:  Allergies as of 03/30/2023   No Known Allergies      Medication List        Accurate as of March 30, 2023  3:37 PM. If you have any questions, ask your nurse or doctor.          cetirizine 10 MG tablet Commonly known as: ZYRTEC Take 10 mg by mouth daily.   OVER THE COUNTER MEDICATION Excedrin migraine  PRN        Allergies: No Known Allergies  Past Medical History, Surgical history, Social history, and Family History were reviewed and updated.  Review of Systems: All other 10 point review of systems is negative.   Physical Exam:  height is 5\' 4"  (1.626 m) and weight is 199 lb 3.2 oz (90.4 kg). Her oral temperature is 98.3 F (36.8 C). Her blood pressure is 101/74 and her pulse is 80. Her respiration is 18 and oxygen saturation is 100%.   Wt Readings from Last 3 Encounters:  03/30/23 199 lb 3.2 oz (90.4 kg)  02/24/23 190 lb (86.2 kg)  02/03/23 190 lb (86.2 kg)    Ocular: Sclerae unicteric, pupils equal, round and reactive to light Ear-nose-throat:  Oropharynx clear, dentition fair Lymphatic: No cervical or supraclavicular adenopathy Lungs no rales or rhonchi, good excursion bilaterally Heart regular rate and rhythm, no murmur appreciated Abd soft, nontender, positive bowel sounds MSK no focal spinal tenderness, no joint edema Neuro: non-focal, well-oriented, appropriate affect  Lab Results  Component Value Date   WBC 5.8 03/30/2023   HGB 12.2 03/30/2023   HCT 36.4 03/30/2023   MCV 97.3 03/30/2023   PLT 277 03/30/2023   Lab Results  Component Value Date   FERRITIN 53 12/31/2022   IRON 89 12/31/2022   TIBC 315 12/31/2022   UIBC 226 12/31/2022   IRONPCTSAT 28 12/31/2022   Lab Results  Component Value Date   RBC 3.74 (L) 03/30/2023   No results found for: "KPAFRELGTCHN", "LAMBDASER", "KAPLAMBRATIO" No results found for: "IGGSERUM", "IGA", "IGMSERUM" No results found for: "TOTALPROTELP", "ALBUMINELP", "A1GS", "A2GS", "BETS", "BETA2SER", "GAMS", "MSPIKE", "SPEI"   Chemistry      Component Value Date/Time   NA 139 03/30/2023 1458   K 3.8 03/30/2023 1458   CL 107 03/30/2023 1458   CO2 25 03/30/2023 1458   BUN 16 03/30/2023 1458   CREATININE 0.70 03/30/2023 1458      Component Value Date/Time   CALCIUM 8.7 (L) 03/30/2023 1458   ALKPHOS 42 03/30/2023 1458   AST 17 03/30/2023 1458  ALT 14 03/30/2023 1458   BILITOT 0.4 03/30/2023 1458      Encounter Diagnoses  Name Primary?   Positive D dimer    Personal history of venous thrombosis and embolism    Hereditary hemochromatosis (HCC) Yes   Impression and Plan: Ms. Tara Tucker is a very pleasant 53 yo caucasian female with hereditary hemochromatosis, heterozygous for the C282Y mutation. She also has history of PE in 2012 and had low protein c total and low protein s total and activity however repeat labs showed a Protein C activity of 161 on 11/11/2012 and Normal Protein S activity.   At her last visit she had a cough and there was some concern for potential PE given an  elevated D-dimer. Fortunately CT showed no evidence of pulmonary embolism. Will recheck D-dimer today- she is now asymptomatic to see what her baseline is.   Today Hgb is 12.2. HCT 36.4 Iron studies pending. She will donate with [facility] if needed.   RTC 3 months APP, labs (CBC, CMP, iron, ferritin)-[place]  Rushie Chestnut, PA-C 1/21/20253:37 PM

## 2023-03-31 ENCOUNTER — Encounter: Payer: Self-pay | Admitting: Family

## 2023-03-31 LAB — IRON AND IRON BINDING CAPACITY (CC-WL,HP ONLY)
Iron: 120 ug/dL (ref 28–170)
Saturation Ratios: 43 % — ABNORMAL HIGH (ref 10.4–31.8)
TIBC: 281 ug/dL (ref 250–450)
UIBC: 161 ug/dL (ref 148–442)

## 2023-04-01 ENCOUNTER — Encounter: Payer: Self-pay | Admitting: Medical Oncology

## 2023-06-29 ENCOUNTER — Encounter: Payer: Self-pay | Admitting: Medical Oncology

## 2023-06-29 ENCOUNTER — Other Ambulatory Visit: Payer: Self-pay

## 2023-06-29 ENCOUNTER — Inpatient Hospital Stay: Payer: BC Managed Care – PPO | Attending: Hematology & Oncology

## 2023-06-29 ENCOUNTER — Inpatient Hospital Stay (HOSPITAL_BASED_OUTPATIENT_CLINIC_OR_DEPARTMENT_OTHER): Payer: BC Managed Care – PPO | Admitting: Medical Oncology

## 2023-06-29 DIAGNOSIS — Z86718 Personal history of other venous thrombosis and embolism: Secondary | ICD-10-CM

## 2023-06-29 DIAGNOSIS — Z86711 Personal history of pulmonary embolism: Secondary | ICD-10-CM | POA: Insufficient documentation

## 2023-06-29 LAB — CMP (CANCER CENTER ONLY)
ALT: 18 U/L (ref 0–44)
AST: 16 U/L (ref 15–41)
Albumin: 4.5 g/dL (ref 3.5–5.0)
Alkaline Phosphatase: 44 U/L (ref 38–126)
Anion gap: 8 (ref 5–15)
BUN: 16 mg/dL (ref 6–20)
CO2: 30 mmol/L (ref 22–32)
Calcium: 9 mg/dL (ref 8.9–10.3)
Chloride: 106 mmol/L (ref 98–111)
Creatinine: 0.73 mg/dL (ref 0.44–1.00)
GFR, Estimated: >60 mL/min (ref >60)
Glucose, Bld: 118 mg/dL — ABNORMAL HIGH (ref 70–99)
Potassium: 3.7 mmol/L (ref 3.5–5.1)
Sodium: 144 mmol/L (ref 135–145)
Total Bilirubin: 0.3 mg/dL (ref 0.0–1.2)
Total Protein: 6.8 g/dL (ref 6.5–8.1)

## 2023-06-29 LAB — CBC
HCT: 38.4 % (ref 36.0–46.0)
Hemoglobin: 12.9 g/dL (ref 12.0–15.0)
MCH: 32.4 pg (ref 26.0–34.0)
MCHC: 33.6 g/dL (ref 30.0–36.0)
MCV: 96.5 fL (ref 80.0–100.0)
Platelets: 277 10*3/uL (ref 150–400)
RBC: 3.98 MIL/uL (ref 3.87–5.11)
RDW: 12.6 % (ref 11.5–15.5)
WBC: 6.8 10*3/uL (ref 4.0–10.5)
nRBC: 0 % (ref 0.0–0.2)

## 2023-06-29 LAB — FERRITIN: Ferritin: 63 ng/mL (ref 11–307)

## 2023-06-29 NOTE — Progress Notes (Signed)
 Hematology and Oncology Follow Up Visit  Tara Tucker [id number] October 28, 1970 53 y.o. 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   Cough is chronic- she is going to see an ENT regarding this No SOB or chest pain Stress is still very high No recent travel, procedures.  Not on any hormonal medication.  No swelling in her extremities at this time.  No falls or syncope reported.  No blood loss, bruising or petechiae.  No fever, chills, n/v, cough, rash, dizziness, palpitations, abdominal pain or changes in bowel or bladder habits.  Appetite and hydration are good.   ECOG Performance Status: 1 - Symptomatic but completely ambulatory  Medications:  Allergies as of 06/29/2023   No Known Allergies      Medication List        Accurate as of June 29, 2023  3:22 PM. If you have any questions, ask your nurse or doctor.          CALCIUM 500 + D PO Take 1 tablet by mouth daily.   cetirizine 10 MG tablet Commonly known as: ZYRTEC Take 10 mg by mouth daily.   OVER THE COUNTER MEDICATION Excedrin migraine  PRN        Allergies: No Known Allergies  Past Medical History, Surgical history, Social history, and Family History were reviewed and updated.  Review of Systems: All other 10 point review of systems is negative.   Physical Exam:  weight is 196 lb 6.4 oz (89.1 kg). Her blood pressure is 101/62 and her pulse is 95. Her oxygen saturation is 100%.   Wt Readings from Last 3 Encounters:  06/29/23 196 lb 6.4 oz (89.1 kg)  03/30/23 199 lb 3.2 oz (90.4 kg)  02/24/23 190 lb (86.2 kg)    Ocular:  Sclerae unicteric, pupils equal, round and reactive to light Ear-nose-throat: Oropharynx clear, dentition fair Lymphatic: No cervical or supraclavicular adenopathy Lungs no rales or rhonchi, good excursion bilaterally Heart regular rate and rhythm, no murmur appreciated Abd soft, nontender, positive bowel sounds MSK no focal spinal tenderness, no joint edema Neuro: non-focal, well-oriented, appropriate affect  Lab Results  Component Value Date   WBC 6.8 06/29/2023   HGB 12.9 06/29/2023   HCT 38.4 06/29/2023   MCV 96.5 06/29/2023   PLT 277 06/29/2023   Lab Results  Component Value Date   FERRITIN 39 03/30/2023   IRON 120 03/30/2023   TIBC 281 03/30/2023   UIBC 161 03/30/2023   IRONPCTSAT 43 (H) 03/30/2023   Lab Results  Component Value Date   RBC 3.98 06/29/2023   No results found for: "KPAFRELGTCHN", "LAMBDASER", "KAPLAMBRATIO" No results found for: "IGGSERUM", "IGA", "IGMSERUM" No results found for: "TOTALPROTELP", "ALBUMINELP", "A1GS", "A2GS", "BETS", "BETA2SER", "GAMS", "MSPIKE", "SPEI"   Chemistry      Component Value Date/Time   NA 144 06/29/2023 1431   K 3.7 06/29/2023 1431   CL 106 06/29/2023 1431   CO2 30 06/29/2023 1431   BUN 16 06/29/2023 1431   CREATININE 0.73 06/29/2023 1431      Component Value Date/Time   CALCIUM 9.0 06/29/2023 1431   ALKPHOS  44 06/29/2023 1431   AST 16 06/29/2023 1431   ALT 18 06/29/2023 1431   BILITOT 0.3 06/29/2023 1431      Encounter Diagnoses  Name Primary?   Personal history of venous thrombosis and embolism    Hereditary hemochromatosis (HCC) Yes    Impression and Plan: Tara Tucker is a very pleasant 53 yo caucasian female with hereditary hemochromatosis, heterozygous for the C282Y mutation. She also has history of PE in 2012 and had low protein c total and low protein s total and activity however repeat labs showed a Protein C activity of 161 on 11/11/2012 and Normal Protein S activity.   She is UTD on her eye exams Last  Abdominal US  was in 2021 however she has had good control of her ferritin levels since, LFTS normal and she denies any abdominal pains or liver enlargement on exam. Will plan for repeat around 2026 or sooner if needed TSH values will be obtained at follow up   Today Hgb is 12.9. HCT 38.4 Iron studies pending. She will donate with [facility] if needed.   RTC 3 months APP, labs (CBC, CMP, iron, ferritin, TSH)-[facility]  Alonza Arthurs [place], [place] 4/22/20253:22 PM

## 2023-06-30 ENCOUNTER — Encounter: Payer: Self-pay | Admitting: Medical Oncology

## 2023-06-30 LAB — IRON AND IRON BINDING CAPACITY (CC-WL,HP ONLY)
Iron: 107 ug/dL (ref 28–170)
Saturation Ratios: 34 % — ABNORMAL HIGH (ref 10.4–31.8)
TIBC: 315 ug/dL (ref 250–450)
UIBC: 208 ug/dL (ref 148–442)

## 2023-06-30 LAB — TSH: TSH: 1.63 u[IU]/mL (ref 0.350–4.500)

## 2023-09-28 ENCOUNTER — Inpatient Hospital Stay: Attending: Hematology & Oncology

## 2023-09-28 ENCOUNTER — Encounter: Payer: Self-pay | Admitting: Medical Oncology

## 2023-09-28 ENCOUNTER — Inpatient Hospital Stay (HOSPITAL_BASED_OUTPATIENT_CLINIC_OR_DEPARTMENT_OTHER): Admitting: Medical Oncology

## 2023-09-28 DIAGNOSIS — Z86711 Personal history of pulmonary embolism: Secondary | ICD-10-CM | POA: Insufficient documentation

## 2023-09-28 DIAGNOSIS — Z86718 Personal history of other venous thrombosis and embolism: Secondary | ICD-10-CM | POA: Diagnosis not present

## 2023-09-28 LAB — IRON AND IRON BINDING CAPACITY (CC-WL,HP ONLY)
Iron: 58 ug/dL (ref 28–170)
Saturation Ratios: 24 % (ref 10.4–31.8)
TIBC: 241 ug/dL — ABNORMAL LOW (ref 250–450)
UIBC: 183 ug/dL

## 2023-09-28 LAB — CMP (CANCER CENTER ONLY)
ALT: 19 U/L (ref 0–44)
AST: 19 U/L (ref 15–41)
Albumin: 4.4 g/dL (ref 3.5–5.0)
Alkaline Phosphatase: 46 U/L (ref 38–126)
Anion gap: 11 (ref 5–15)
BUN: 9 mg/dL (ref 6–20)
CO2: 25 mmol/L (ref 22–32)
Calcium: 9.1 mg/dL (ref 8.9–10.3)
Chloride: 104 mmol/L (ref 98–111)
Creatinine: 0.6 mg/dL (ref 0.44–1.00)
GFR, Estimated: >60 mL/min (ref >60)
Glucose, Bld: 106 mg/dL — ABNORMAL HIGH (ref 70–99)
Potassium: 3.7 mmol/L (ref 3.5–5.1)
Sodium: 140 mmol/L (ref 135–145)
Total Bilirubin: 0.3 mg/dL (ref 0.0–1.2)
Total Protein: 7 g/dL (ref 6.5–8.1)

## 2023-09-28 LAB — CBC
HCT: 36.4 % (ref 36.0–46.0)
Hemoglobin: 12.4 g/dL (ref 12.0–15.0)
MCH: 32.1 pg (ref 26.0–34.0)
MCHC: 34.1 g/dL (ref 30.0–36.0)
MCV: 94.3 fL (ref 80.0–100.0)
Platelets: 268 K/uL (ref 150–400)
RBC: 3.86 MIL/uL — ABNORMAL LOW (ref 3.87–5.11)
RDW: 12.5 % (ref 11.5–15.5)
WBC: 6 K/uL (ref 4.0–10.5)
nRBC: 0 % (ref 0.0–0.2)

## 2023-09-28 LAB — FERRITIN: Ferritin: 118 ng/mL (ref 11–307)

## 2023-09-28 NOTE — Progress Notes (Signed)
 Hematology and Oncology Follow Up Visit  Tara Tucker [id number] Dec 16, 1970 53 y.o. 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   She reports that she has been doing ok.  No SOB or chest pain Stress is still very high No recent travel, procedures.  Not on any hormonal medication.  No swelling in her extremities at this time.  No falls or syncope reported.  No blood loss, bruising or petechiae.  No fever, chills, n/v, cough, rash, dizziness, palpitations, abdominal pain or changes in bowel or bladder habits.  Appetite and hydration are good.   ECOG Performance Status: 1 - Symptomatic but completely ambulatory  Medications:  Allergies as of 09/28/2023   No Known Allergies      Medication List        Accurate as of September 28, 2023  4:18 PM. If you have any questions, ask your nurse or doctor.          CALCIUM 500 + D PO Take 1 tablet by mouth daily.   cetirizine 10 MG tablet Commonly known as: ZYRTEC Take 10 mg by mouth daily.   Multiple Vitamins tablet Take 1 tablet by mouth daily.   OVER THE COUNTER MEDICATION Excedrin migraine  PRN   tirzepatide 2.5 MG/0.5ML injection vial Commonly known as: ZEPBOUND Inject 2.5 mg into the skin once a week. What changed: Another medication with the same name was removed. Continue taking this medication, and follow the directions you see here. Changed by: Lauraine CHRISTELLA Dais        Allergies: No Known Allergies  Past Medical History, Surgical history, Social history, and Family History were reviewed and updated.  Review of Systems: All other 10 point  review of systems is negative.   Physical Exam:  height is 5' 4 (1.626 m) and weight is 173 lb 6.4 oz (78.7 kg). Her oral temperature is 98.3 F (36.8 C). Her blood pressure is 90/69 and her pulse is 70. Her respiration is 18 and oxygen saturation is 99%.   Wt Readings from Last 3 Encounters:  09/28/23 173 lb 6.4 oz (78.7 kg)  06/29/23 196 lb 6.4 oz (89.1 kg)  03/30/23 199 lb 3.2 oz (90.4 kg)    Ocular: Sclerae unicteric, pupils equal, round and reactive to light Ear-nose-throat: Oropharynx clear, dentition fair Lymphatic: No cervical or supraclavicular adenopathy Lungs no rales or rhonchi, good excursion bilaterally Heart regular rate and rhythm, no murmur appreciated Abd soft, nontender, positive bowel sounds MSK no focal spinal tenderness, no joint edema Neuro: non-focal, well-oriented, appropriate affect  Lab Results  Component Value Date   WBC 6.0 09/28/2023   HGB 12.4 09/28/2023   HCT 36.4 09/28/2023   MCV 94.3 09/28/2023   PLT 268 09/28/2023   Lab Results  Component Value Date   FERRITIN 63 06/29/2023   IRON 107 06/29/2023   TIBC 315 06/29/2023   UIBC 208 06/29/2023   IRONPCTSAT 34 (H) 06/29/2023   Lab Results  Component Value Date   RBC 3.86 (L) 09/28/2023   No results found for: KPAFRELGTCHN, LAMBDASER, KAPLAMBRATIO No results found for: IGGSERUM, IGA, IGMSERUM No  results found for: STEPHANY CARLOTA BENSON MARKEL EARLA JOANNIE DOC VICK, SPEI   Chemistry      Component Value Date/Time   NA 140 09/28/2023 1418   K 3.7 09/28/2023 1418   CL 104 09/28/2023 1418   CO2 25 09/28/2023 1418   BUN 9 09/28/2023 1418   CREATININE 0.60 09/28/2023 1418      Component Value Date/Time   CALCIUM 9.1 09/28/2023 1418   ALKPHOS 46 09/28/2023 1418   AST 19 09/28/2023 1418   ALT 19 09/28/2023 1418   BILITOT 0.3 09/28/2023 1418      Encounter Diagnoses  Name Primary?   Personal history of venous thrombosis and embolism     Hereditary hemochromatosis (HCC) Yes    Impression and Plan: Tara Tucker is a very pleasant 53 yo caucasian female with hereditary hemochromatosis, heterozygous for the C282Y mutation. She also has history of PE in 2012 and had low protein c total and low protein s total and activity however repeat labs showed a Protein C activity of 161 on 11/11/2012 and Normal Protein S activity.   She is UTD on her eye exams Last Abdominal US  was in 2021 however she has had good control of her ferritin levels since, LFTS normal and she denies any abdominal pains or liver enlargement on exam. Will plan for repeat around 2026 or sooner if needed TSH normal on 06/29/2023  Today Hgb is 12.4. HCT 36.4 Iron studies pending. She will donate with [facility] if needed.   RTC 3 months APP, labs (CBC, CMP, iron, ferritin)-[place]  Lauraine CHRISTELLA Dais, [place] 7/22/20254:18 PM

## 2023-09-29 ENCOUNTER — Ambulatory Visit: Payer: Self-pay | Admitting: Medical Oncology

## 2023-12-29 ENCOUNTER — Inpatient Hospital Stay: Attending: Hematology & Oncology

## 2023-12-29 ENCOUNTER — Inpatient Hospital Stay (HOSPITAL_BASED_OUTPATIENT_CLINIC_OR_DEPARTMENT_OTHER): Admitting: Medical Oncology

## 2023-12-29 ENCOUNTER — Encounter: Payer: Self-pay | Admitting: Medical Oncology

## 2023-12-29 DIAGNOSIS — Z86711 Personal history of pulmonary embolism: Secondary | ICD-10-CM | POA: Diagnosis not present

## 2023-12-29 DIAGNOSIS — Z86718 Personal history of other venous thrombosis and embolism: Secondary | ICD-10-CM

## 2023-12-29 LAB — IRON AND IRON BINDING CAPACITY (CC-WL,HP ONLY)
Iron: 64 ug/dL (ref 28–170)
Saturation Ratios: 24 % (ref 10.4–31.8)
TIBC: 266 ug/dL (ref 250–450)
UIBC: 202 ug/dL

## 2023-12-29 LAB — CBC
HCT: 37 % (ref 36.0–46.0)
Hemoglobin: 12.4 g/dL (ref 12.0–15.0)
MCH: 32.7 pg (ref 26.0–34.0)
MCHC: 33.5 g/dL (ref 30.0–36.0)
MCV: 97.6 fL (ref 80.0–100.0)
Platelets: 283 K/uL (ref 150–400)
RBC: 3.79 MIL/uL — ABNORMAL LOW (ref 3.87–5.11)
RDW: 12.8 % (ref 11.5–15.5)
WBC: 5.9 K/uL (ref 4.0–10.5)
nRBC: 0 % (ref 0.0–0.2)

## 2023-12-29 LAB — CMP (CANCER CENTER ONLY)
ALT: 15 U/L (ref 0–44)
AST: 19 U/L (ref 15–41)
Albumin: 4.5 g/dL (ref 3.5–5.0)
Alkaline Phosphatase: 50 U/L (ref 38–126)
Anion gap: 13 (ref 5–15)
BUN: 14 mg/dL (ref 6–20)
CO2: 25 mmol/L (ref 22–32)
Calcium: 9.2 mg/dL (ref 8.9–10.3)
Chloride: 107 mmol/L (ref 98–111)
Creatinine: 0.68 mg/dL (ref 0.44–1.00)
GFR, Estimated: >60 mL/min (ref >60)
Glucose, Bld: 108 mg/dL — ABNORMAL HIGH (ref 70–99)
Potassium: 4 mmol/L (ref 3.5–5.1)
Sodium: 145 mmol/L (ref 135–145)
Total Bilirubin: 0.4 mg/dL (ref 0.0–1.2)
Total Protein: 7 g/dL (ref 6.5–8.1)

## 2023-12-29 LAB — FERRITIN: Ferritin: 41 ng/mL (ref 11–307)

## 2023-12-29 NOTE — Progress Notes (Signed)
 Hematology and Oncology Follow Up Visit  Tara Tucker [id number] 11-02-1970 53 y.o. 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 Tara Tucker is here today for follow-up.   She has been doing well.  S/p uterine ablation many years ago.  There has been no bleeding to her knowledge: denies epistaxis, gingivitis, hemoptysis, hematemesis, hematuria, melena, excessive bruising, blood donation.  No SOB or chest pain Stress is still very high No recent travel, procedures.  Not on any hormonal medication.  No swelling in her extremities at this time.  No falls or syncope reported.  No blood loss, bruising or petechiae.  No fever, chills, n/v, cough, rash, dizziness, palpitations, abdominal pain or changes in bowel or bladder habits.  Appetite and hydration are good.   ECOG Performance Status: 1 - Symptomatic but completely ambulatory  Medications:  Allergies as of 12/29/2023   No Known Allergies      Medication List        Accurate as of December 29, 2023  3:41 PM. If you have any questions, ask your nurse or doctor.          CALCIUM 500 + D PO Take 1 tablet by mouth daily.   cetirizine 10 MG tablet Commonly known as: ZYRTEC Take 10 mg by mouth daily.   Multiple Vitamins tablet Take 1 tablet by mouth daily.   OVER THE COUNTER MEDICATION Excedrin migraine  PRN   tirzepatide 2.5 MG/0.5ML injection vial Commonly known as: ZEPBOUND Inject 2.5 mg into the skin once a week.   tirzepatide 5 MG/0.5ML Pen Commonly known as: MOUNJARO Inject 5 mg into the skin once a week.         Allergies: No Known Allergies  Past Medical History, Surgical history, Social history, and Family History were reviewed and updated.  Review of Systems: All other 10 point review of systems is negative.   Physical Exam:  weight is 159 lb 6.4 oz (72.3 kg). Her oral temperature is 98.2 F (36.8 C). Her blood pressure is 96/62 and her pulse is 88. Her respiration is 17 and oxygen saturation is 100%.   Wt Readings from Last 3 Encounters:  12/29/23 159 lb 6.4 oz (72.3 kg)  09/28/23 173 lb 6.4 oz (78.7 kg)  06/29/23 196 lb 6.4 oz (89.1 kg)    Ocular: Sclerae unicteric, pupils equal, round and reactive to light Ear-nose-throat: Oropharynx clear, dentition fair Lymphatic: No cervical or supraclavicular adenopathy Lungs no rales or rhonchi, good excursion bilaterally Heart regular rate and rhythm, no murmur appreciated Abd soft, nontender, positive bowel sounds MSK no focal spinal tenderness, no joint edema Neuro: non-focal, well-oriented, appropriate affect  Lab Results  Component Value Date   WBC 5.9 12/29/2023   HGB 12.4 12/29/2023   HCT 37.0 12/29/2023   MCV 97.6 12/29/2023   PLT 283 12/29/2023   Lab Results  Component Value Date   FERRITIN 118 09/28/2023   IRON 58 09/28/2023   TIBC 241 (L) 09/28/2023   UIBC 183 09/28/2023   IRONPCTSAT 24 09/28/2023   Lab Results  Component Value Date   RBC  3.79 (L) 12/29/2023   No results found for: KPAFRELGTCHN, LAMBDASER, KAPLAMBRATIO No results found for: IGGSERUM, IGA, IGMSERUM No results found for: STEPHANY CARLOTA BENSON MARKEL EARLA JOANNIE DOC VICK, SPEI   Chemistry      Component Value Date/Time   NA 140 09/28/2023 1418   K 3.7 09/28/2023 1418   CL 104 09/28/2023 1418   CO2 25 09/28/2023 1418   BUN 9 09/28/2023 1418   CREATININE 0.60 09/28/2023 1418      Component Value Date/Time   CALCIUM 9.1 09/28/2023 1418   ALKPHOS 46 09/28/2023 1418   AST 19 09/28/2023 1418   ALT  19 09/28/2023 1418   BILITOT 0.3 09/28/2023 1418      Encounter Diagnosis  Name Primary?   Hereditary hemochromatosis Yes    Impression and Plan: Ms. Tara Tucker is a very pleasant 52 yo caucasian female with hereditary hemochromatosis, heterozygous for the C282Y mutation. She also has history of PE in 2012 and had low protein c total and low protein s total and activity however repeat labs showed a Protein C activity of 161 on 11/11/2012 and Normal Protein S activity.   She is UTD on her eye exams Repeat US  abdomen ordered.  TSH normal on 06/29/2023  Today Hgb is 12.4. HCT 36.4 Iron studies pending. She will donate with [facility] if needed.   RTC 6 months APP, labs (CBC, CMP, iron, ferritin)-[place]  Lauraine CHRISTELLA Dais, PA-C 10/22/20253:41 PM

## 2023-12-30 ENCOUNTER — Ambulatory Visit: Payer: Self-pay | Admitting: Medical Oncology

## 2024-01-05 ENCOUNTER — Telehealth (HOSPITAL_BASED_OUTPATIENT_CLINIC_OR_DEPARTMENT_OTHER): Payer: Self-pay

## 2024-01-21 ENCOUNTER — Ambulatory Visit (HOSPITAL_BASED_OUTPATIENT_CLINIC_OR_DEPARTMENT_OTHER)
Admission: RE | Admit: 2024-01-21 | Discharge: 2024-01-21 | Disposition: A | Discharge status: Home / Self Care | Source: Ambulatory Visit | Attending: Medical Oncology | Admitting: Medical Oncology

## 2024-01-25 ENCOUNTER — Other Ambulatory Visit: Payer: Self-pay | Admitting: Medical Oncology

## 2024-01-25 ENCOUNTER — Ambulatory Visit: Payer: Self-pay | Admitting: Medical Oncology

## 2024-01-25 DIAGNOSIS — N281 Cyst of kidney, acquired: Secondary | ICD-10-CM

## 2024-01-26 ENCOUNTER — Other Ambulatory Visit: Payer: Self-pay | Admitting: Medical Oncology

## 2024-01-26 DIAGNOSIS — N281 Cyst of kidney, acquired: Secondary | ICD-10-CM

## 2024-02-08 ENCOUNTER — Ambulatory Visit (HOSPITAL_BASED_OUTPATIENT_CLINIC_OR_DEPARTMENT_OTHER)
Admission: RE | Admit: 2024-02-08 | Discharge: 2024-02-08 | Disposition: A | Source: Ambulatory Visit | Attending: Medical Oncology | Admitting: Medical Oncology

## 2024-02-08 ENCOUNTER — Ambulatory Visit: Payer: Self-pay | Admitting: Medical Oncology

## 2024-02-08 ENCOUNTER — Other Ambulatory Visit: Payer: Self-pay | Admitting: Medical Oncology

## 2024-02-08 DIAGNOSIS — N281 Cyst of kidney, acquired: Secondary | ICD-10-CM | POA: Insufficient documentation

## 2024-02-08 DIAGNOSIS — R93429 Abnormal radiologic findings on diagnostic imaging of unspecified kidney: Secondary | ICD-10-CM

## 2024-02-08 MED ORDER — GADOBUTROL 1 MMOL/ML IV SOLN
7.2000 mL | Freq: Once | INTRAVENOUS | Status: AC | PRN
  Administered 2024-02-08: 7.2 mL via INTRAVENOUS

## 2024-02-09 ENCOUNTER — Other Ambulatory Visit: Payer: Self-pay | Admitting: *Deleted

## 2024-02-09 DIAGNOSIS — N281 Cyst of kidney, acquired: Secondary | ICD-10-CM

## 2024-02-09 DIAGNOSIS — R93429 Abnormal radiologic findings on diagnostic imaging of unspecified kidney: Secondary | ICD-10-CM

## 2024-02-15 ENCOUNTER — Encounter: Payer: Self-pay | Admitting: Family

## 2024-02-15 ENCOUNTER — Telehealth: Payer: Self-pay

## 2024-02-15 NOTE — Telephone Encounter (Signed)
 Faxed referral to [facility]/Dr. Silas Keep office at [phone].

## 2024-02-16 ENCOUNTER — Telehealth: Payer: Self-pay

## 2024-02-16 ENCOUNTER — Other Ambulatory Visit: Payer: Self-pay

## 2024-02-16 DIAGNOSIS — N281 Cyst of kidney, acquired: Secondary | ICD-10-CM

## 2024-02-16 DIAGNOSIS — R93429 Abnormal radiologic findings on diagnostic imaging of unspecified kidney: Secondary | ICD-10-CM

## 2024-02-16 NOTE — Telephone Encounter (Signed)
 At patient's request the referral to Dr. Roseann was cancelled ad sent to [facility].

## 2024-02-16 NOTE — Telephone Encounter (Signed)
 Patient sent a MY Chart message inquiring about her referral. I called to inform her that the Nephology office recommends for her to  be seen by a urologist , based on the fax that we received this morning.  Referral sent to Dr. Roseann.

## 2024-02-21 ENCOUNTER — Telehealth: Payer: Self-pay | Admitting: *Deleted

## 2024-02-21 NOTE — Telephone Encounter (Signed)
 Adele called asking about Urgent referral not being sent to [facility].  Informed patient that referral is documented as being sent with confirmed Fax receipt.  Urgent referral refaxed to confirmed correct fax number at [facility] office.  [phone].

## 2024-06-28 ENCOUNTER — Inpatient Hospital Stay: Attending: Hematology & Oncology

## 2024-06-28 ENCOUNTER — Inpatient Hospital Stay: Admitting: Medical Oncology
